# Patient Record
Sex: Male | Born: 1990 | Hispanic: No | Marital: Married | Smoking: Former smoker
Health system: Southern US, Community
[De-identification: ages and names within clinical notes are randomized; demographics above are authoritative.]

## PROBLEM LIST (undated history)

## (undated) DIAGNOSIS — L509 Urticaria, unspecified: Secondary | ICD-10-CM

## (undated) DIAGNOSIS — G709 Myoneural disorder, unspecified: Secondary | ICD-10-CM

## (undated) DIAGNOSIS — H1010 Acute atopic conjunctivitis, unspecified eye: Secondary | ICD-10-CM

## (undated) DIAGNOSIS — T7840XA Allergy, unspecified, initial encounter: Secondary | ICD-10-CM

## (undated) DIAGNOSIS — R7309 Other abnormal glucose: Secondary | ICD-10-CM

## (undated) DIAGNOSIS — F419 Anxiety disorder, unspecified: Secondary | ICD-10-CM

## (undated) DIAGNOSIS — F329 Major depressive disorder, single episode, unspecified: Secondary | ICD-10-CM

## (undated) DIAGNOSIS — J309 Allergic rhinitis, unspecified: Secondary | ICD-10-CM

## (undated) DIAGNOSIS — F32A Depression, unspecified: Secondary | ICD-10-CM

## (undated) HISTORY — PX: LASIK: SHX215

## (undated) HISTORY — DX: Depression, unspecified: F32.A

## (undated) HISTORY — DX: Major depressive disorder, single episode, unspecified: F32.9

## (undated) HISTORY — DX: Allergic rhinitis, unspecified: J30.9

## (undated) HISTORY — DX: Anxiety disorder, unspecified: F41.9

## (undated) HISTORY — DX: Allergy, unspecified, initial encounter: T78.40XA

## (undated) HISTORY — PX: EYE SURGERY: SHX253

## (undated) HISTORY — DX: Other abnormal glucose: R73.09

## (undated) HISTORY — DX: Myoneural disorder, unspecified: G70.9

## (undated) HISTORY — DX: Urticaria, unspecified: L50.9

## (undated) HISTORY — DX: Acute atopic conjunctivitis, unspecified eye: H10.10

---

## 2014-12-25 ENCOUNTER — Emergency Department (HOSPITAL_COMMUNITY)
Admission: EM | Admit: 2014-12-25 | Discharge: 2014-12-25 | Disposition: A | Discharge status: Home / Self Care | Payer: PPO | Attending: Emergency Medicine | Admitting: Emergency Medicine

## 2014-12-25 ENCOUNTER — Encounter (HOSPITAL_COMMUNITY): Payer: Self-pay

## 2014-12-25 ENCOUNTER — Emergency Department (HOSPITAL_COMMUNITY): Payer: PPO

## 2014-12-25 DIAGNOSIS — R569 Unspecified convulsions: Secondary | ICD-10-CM | POA: Diagnosis not present

## 2014-12-25 DIAGNOSIS — Z87891 Personal history of nicotine dependence: Secondary | ICD-10-CM | POA: Diagnosis not present

## 2014-12-25 LAB — BASIC METABOLIC PANEL
Anion gap: 11 (ref 5–15)
BUN: 12 mg/dL (ref 6–23)
CALCIUM: 9.6 mg/dL (ref 8.4–10.5)
CHLORIDE: 103 mmol/L (ref 96–112)
CO2: 24 mmol/L (ref 19–32)
Creatinine, Ser: 1.03 mg/dL (ref 0.50–1.35)
GFR calc Af Amer: >90 mL/min (ref >90)
GFR calc non Af Amer: >90 mL/min (ref >90)
GLUCOSE: 115 mg/dL — AB (ref 70–99)
Potassium: 4.1 mmol/L (ref 3.5–5.1)
SODIUM: 138 mmol/L (ref 135–145)

## 2014-12-25 LAB — URINALYSIS, ROUTINE W REFLEX MICROSCOPIC
Bilirubin Urine: NEGATIVE
GLUCOSE, UA: NEGATIVE mg/dL
HGB URINE DIPSTICK: NEGATIVE
Ketones, ur: NEGATIVE mg/dL
Leukocytes, UA: NEGATIVE
Nitrite: NEGATIVE
Protein, ur: NEGATIVE mg/dL
SPECIFIC GRAVITY, URINE: 1.017 (ref 1.005–1.030)
Urobilinogen, UA: 0.2 mg/dL (ref 0.0–1.0)
pH: 5.5 (ref 5.0–8.0)

## 2014-12-25 LAB — RAPID URINE DRUG SCREEN, HOSP PERFORMED
AMPHETAMINES: NOT DETECTED
BENZODIAZEPINES: NOT DETECTED
Barbiturates: NOT DETECTED
Cocaine: NOT DETECTED
OPIATES: NOT DETECTED
Tetrahydrocannabinol: NOT DETECTED

## 2014-12-25 NOTE — ED Notes (Signed)
Patient transported to CT 

## 2014-12-25 NOTE — ED Notes (Signed)
Per EMS pt was dancing at night club and got hot; friend states pt fell to about 30 minutes of seizure; pt is a&o x 4 on arrival; pt denies hx of seizures; Pt c/o left neck muscular pain on site but denies pain on arrival; pt has c-collar applied; Pt denies alcohol or drug use; pt states he had 1/2 red bull prior;

## 2014-12-25 NOTE — Discharge Instructions (Signed)
Nonepileptic Seizures °Nonepileptic seizures are seizures that are not caused by abnormal electrical signals in your brain. These seizures often seem like epileptic seizures, but they are not caused by epilepsy.  °There are two types of nonepileptic seizures: °· A physiologic nonepileptic seizure results from a disruption in your brain. °· A psychogenic seizure results from emotional stress. These seizures are sometimes called pseudoseizures. °CAUSES  °Causes of physiologic nonepileptic seizures include:  °· Sudden drop in blood pressure. °· Low blood sugar. °· Low levels of salt (sodium) in your blood. °· Low levels of calcium in your blood. °· Migraine. °· Heart rhythm problems. °· Sleep disorders. °· Drug and alcohol abuse. °Common causes of psychogenic nonepileptic seizures include: °· Stress. °· Emotional trauma. °· Sexual or physical abuse. °· Major life events, such as divorce or the death of a loved one. °· Mental health disorders, including panic attack and hyperactivity disorder. °SIGNS AND SYMPTOMS °A nonepileptic seizure can look like an epileptic seizure, including uncontrollable shaking (convulsions), or changes in attention, behavior, or the ability to remain awake and alert. However, there are some differences. Nonepileptic seizures usually: °· Do not cause physical injuries. °· Start slowly. °· Include crying or shrieking. °· Last longer than 2 minutes. °· Have a short recovery time without headache or exhaustion. °DIAGNOSIS  °Your health care provider can usually diagnose nonepileptic seizures after taking your medical history and giving you a physical exam. Your health care provider may want to talk to your friends or relatives who have seen you have a seizure.  °You may also need to have tests to look for causes of physiologic nonepileptic seizures. This may include an electroencephalogram (EEG), which is a test that measures electrical activity in your brain. If you have had an epileptic  seizure, the results of your EEG will be abnormal. If your health care provider thinks you have had a psychogenic nonepileptic seizure, you may need to see a mental health specialist for an evaluation. °TREATMENT  °Treatment depends on the type and cause of your seizures. °· For physiologic nonepileptic seizures, treatment is aimed at addressing the underlying condition that caused the seizures. These seizures usually stop when the underlying condition is properly treated. °· Nonepileptic seizures do not respond to the seizure medicines used to treat epilepsy. °· For psychogenic seizures, you may need to work with a mental health specialist. °HOME CARE INSTRUCTIONS °Home care will depend on the type of nonepileptic seizures you have.  °· Follow all your health care provider's instructions. °· Keep all your follow-up appointments. °SEEK MEDICAL CARE IF: °You continue to have seizures after treatment. °SEEK IMMEDIATE MEDICAL CARE IF: °· Your seizures change or become more frequent. °· You injure yourself during a seizure. °· You have one seizure after another. °· You have trouble recovering from a seizure. °· You have chest pain or trouble breathing. °MAKE SURE YOU: °· Understand these instructions. °· Will watch your condition. °· Will get help right away if you are not doing well or get worse. °Document Released: 10/20/2005 Document Revised: 01/19/2014 Document Reviewed: 07/01/2013 °[employer]® Patient Information ©2015 [employer]. This information is not intended to replace advice given to you by your health care provider. Make sure you discuss any questions you have with your health care provider. ° °

## 2014-12-25 NOTE — ED Provider Notes (Signed)
CSN: [id number]     Arrival date & time 12/25/14  0304 History   First MD Initiated Contact with Patient 12/25/14 [phone]     Chief Complaint  Patient presents with  . Seizures     (Consider location/radiation/quality/duration/timing/severity/associated sxs/prior Treatment) HPI Comments: He reports he was at a night club tonight with friends, dancing. There was a mist/fogger used for effect. He reports that on initial contact with the mist, he fell and felt as if he was choking. This lasted for a matter of minutes. He has full memory of the episode. No LOC or seizure. Later in the evening, again when he came into contact with the same mist, he fell and, per friends, had a generalized seizure with tongue biting. The patient does not remember the second fall. He states he had one seizure years ago, while in high school. He currently has no symptoms. He denies alcohol or drug use.   Patient is a 24 y.o. male presenting with seizures. The history is provided by the patient and a friend. No language interpreter was used.  Seizures Seizure activity on arrival: no   Seizure type:  Grand mal Episode characteristics: tongue biting   Return to baseline: yes   Number of seizures this episode:  1 History of seizures: yes ("Once in high school.")     History reviewed. No pertinent past medical history. History reviewed. No pertinent past surgical history. History reviewed. No pertinent family history. History  Substance Use Topics  . Smoking status: Former Smoker    Quit date: 08/25/2014  . Smokeless tobacco: Not on file  . Alcohol Use: No    Review of Systems  Constitutional: Negative for fever and chills.  HENT: Negative.        Tongue biting.  Respiratory: Negative.   Cardiovascular: Negative.   Gastrointestinal: Negative.   Musculoskeletal: Negative.   Skin: Negative.   Neurological: Positive for seizures. Negative for weakness and headaches.  Psychiatric/Behavioral: Negative.        Allergies  Review of patient's allergies indicates no known allergies.  Home Medications   Prior to Admission medications   Not on File   BP 124/71 mmHg  Pulse 103  Temp(Src) 98.2 F (36.8 C) (Oral)  Resp 26  SpO2 97% Physical Exam  Constitutional: He is oriented to person, place, and time. He appears well-developed and well-nourished. No distress.  HENT:  Head: Normocephalic and atraumatic.  Eyes: Pupils are equal, round, and reactive to light. Right conjunctiva is injected. Left conjunctiva is injected.  Neck: Normal range of motion.  Cardiovascular: Normal rate.   No murmur heard. Pulmonary/Chest: Effort normal. He has no wheezes. He has no rales.  Abdominal: Soft. There is no tenderness. There is no rebound.  Musculoskeletal: Normal range of motion. He exhibits no edema.  Neurological: He is alert and oriented to person, place, and time. Coordination normal.  Speech clear and focused. No facial asymmetry, lateralizing weakness, abnormal coordination. CN's 3-12 grossly intact.   Skin: Skin is warm and dry.    ED Course  Procedures (including critical care time) Labs Review Labs Reviewed  BASIC METABOLIC PANEL  URINE RAPID DRUG SCREEN (HOSP PERFORMED)  URINALYSIS, ROUTINE W REFLEX MICROSCOPIC   Results for orders placed or performed during the hospital encounter of 83/41/96  Basic metabolic panel  Result Value Ref Range   Sodium 138 135 - 145 mmol/L   Potassium 4.1 3.5 - 5.1 mmol/L   Chloride 103 96 - 112 mmol/L   CO2  24 19 - 32 mmol/L   Glucose, Bld 115 (H) 70 - 99 mg/dL   BUN 12 6 - 23 mg/dL   Creatinine, Ser 1.03 0.50 - 1.35 mg/dL   Calcium 9.6 8.4 - 10.5 mg/dL   GFR calc non Af Amer >90 >90 mL/min   GFR calc Af Amer >90 >90 mL/min   Anion gap 11 5 - 15  Urine rapid drug screen (hosp performed)  Result Value Ref Range   Opiates NONE DETECTED NONE DETECTED   Cocaine NONE DETECTED NONE DETECTED   Benzodiazepines NONE DETECTED NONE DETECTED    Amphetamines NONE DETECTED NONE DETECTED   Tetrahydrocannabinol NONE DETECTED NONE DETECTED   Barbiturates NONE DETECTED NONE DETECTED  Urinalysis, Routine w reflex microscopic  Result Value Ref Range   Color, Urine YELLOW YELLOW   APPearance CLEAR CLEAR   Specific Gravity, Urine 1.017 1.005 - 1.030   pH 5.5 5.0 - 8.0   Glucose, UA NEGATIVE NEGATIVE mg/dL   Hgb urine dipstick NEGATIVE NEGATIVE   Bilirubin Urine NEGATIVE NEGATIVE   Ketones, ur NEGATIVE NEGATIVE mg/dL   Protein, ur NEGATIVE NEGATIVE mg/dL   Urobilinogen, UA 0.2 0.0 - 1.0 mg/dL   Nitrite NEGATIVE NEGATIVE   Leukocytes, UA NEGATIVE NEGATIVE   Ct Head Wo Contrast  12/25/2014   CLINICAL DATA:  [place] while dancing at night club, possible seizure. LEFT neck pain.  EXAM: CT HEAD WITHOUT CONTRAST  CT CERVICAL SPINE WITHOUT CONTRAST  TECHNIQUE: Multidetector CT imaging of the head and cervical spine was performed following the standard protocol without intravenous contrast. Multiplanar CT image reconstructions of the cervical spine were also generated.  COMPARISON:  None.  FINDINGS: CT HEAD FINDINGS  The ventricles and sulci are normal. No intraparenchymal hemorrhage, mass effect nor midline shift. No acute large vascular territory infarcts.  No abnormal extra-axial fluid collections. Basal cisterns are patent.  No skull fracture. The included ocular globes and orbital contents are non-suspicious. Small LEFT maxillary mucosal retention cyst without paranasal sinus air-fluid levels. The mastoid air cells are well aerated.  CT CERVICAL SPINE FINDINGS  Cervical vertebral bodies and posterior elements are intact and aligned with maintenance of the cervical lordosis. Intervertebral disc heights preserved, mild ventral endplate spurring I3-7. No destructive bony lesions. C1-2 articulation maintained. Included prevertebral and paraspinal soft tissues are nonsuspicious; subcentimeter focus of air within the RIGHT neck adjacent to the trachea  consistent with peritracheal air cyst.  IMPRESSION: CT HEAD: No acute intracranial process; normal noncontrast CT of the head.  CT CERVICAL SPINE: Straightened cervical lordosis without acute fracture or malalignment.  Small RIGHT paratracheal air cyst.   Electronically Signed   By: Elon Alas   On: 12/25/2014 05:05   Ct Cervical Spine Wo Contrast  12/25/2014   CLINICAL DATA:  [place] while dancing at night club, possible seizure. LEFT neck pain.  EXAM: CT HEAD WITHOUT CONTRAST  CT CERVICAL SPINE WITHOUT CONTRAST  TECHNIQUE: Multidetector CT imaging of the head and cervical spine was performed following the standard protocol without intravenous contrast. Multiplanar CT image reconstructions of the cervical spine were also generated.  COMPARISON:  None.  FINDINGS: CT HEAD FINDINGS  The ventricles and sulci are normal. No intraparenchymal hemorrhage, mass effect nor midline shift. No acute large vascular territory infarcts.  No abnormal extra-axial fluid collections. Basal cisterns are patent.  No skull fracture. The included ocular globes and orbital contents are non-suspicious. Small LEFT maxillary mucosal retention cyst without paranasal sinus air-fluid levels. The mastoid air cells are  well aerated.  CT CERVICAL SPINE FINDINGS  Cervical vertebral bodies and posterior elements are intact and aligned with maintenance of the cervical lordosis. Intervertebral disc heights preserved, mild ventral endplate spurring K0-9. No destructive bony lesions. C1-2 articulation maintained. Included prevertebral and paraspinal soft tissues are nonsuspicious; subcentimeter focus of air within the RIGHT neck adjacent to the trachea consistent with peritracheal air cyst.  IMPRESSION: CT HEAD: No acute intracranial process; normal noncontrast CT of the head.  CT CERVICAL SPINE: Straightened cervical lordosis without acute fracture or malalignment.  Small RIGHT paratracheal air cyst.   Electronically Signed   By: Elon Alas    On: 12/25/2014 05:05     Imaging Review No results found.   EKG Interpretation   Date/Time:  Friday December 25 2014 03:20:21 EDT Ventricular Rate:  106 PR Interval:  152 QRS Duration: 80 QT Interval:  330 QTC Calculation: 438 R Axis:   18 Text Interpretation:  Sinus tachycardia ST elev, probable normal early  repol pattern Confirmed by Glynn Octave [phone]) on 12/25/2014  3:28:47 AM      MDM   Final diagnoses:  None    1. Seizure  No seizure activity in ED. CT's, labs negative. Discussed the patient with Dr. Janann Colonel (neurology) who advises outpatient EEG without starting him on medications now. Discussed results of evaluation with the patient who is agreeable to outpatient follow up. Stable for discharge.     Charlann Lange, PA-C 12/25/14 [id number]  Everlene Balls, MD 12/27/14 [phone]

## 2014-12-29 ENCOUNTER — Telehealth: Payer: Self-pay | Admitting: *Deleted

## 2014-12-29 NOTE — Telephone Encounter (Signed)
Neurologist referred to does not accept pt insurance.  NCM advised to establish PCP for new referral.

## 2014-12-31 ENCOUNTER — Ambulatory Visit: Payer: Self-pay | Admitting: Neurology

## 2015-09-01 ENCOUNTER — Ambulatory Visit (INDEPENDENT_AMBULATORY_CARE_PROVIDER_SITE_OTHER): Payer: PPO | Admitting: Allergy and Immunology

## 2015-09-01 ENCOUNTER — Encounter: Payer: Self-pay | Admitting: Allergy and Immunology

## 2015-09-01 VITALS — BP 114/76 | HR 80 | Temp 98.3°F | Resp 16 | Ht 65.75 in | Wt 146.8 lb

## 2015-09-01 DIAGNOSIS — H04129 Dry eye syndrome of unspecified lacrimal gland: Secondary | ICD-10-CM | POA: Insufficient documentation

## 2015-09-01 DIAGNOSIS — L500 Allergic urticaria: Secondary | ICD-10-CM | POA: Diagnosis not present

## 2015-09-01 DIAGNOSIS — J309 Allergic rhinitis, unspecified: Secondary | ICD-10-CM

## 2015-09-01 DIAGNOSIS — H04123 Dry eye syndrome of bilateral lacrimal glands: Secondary | ICD-10-CM

## 2015-09-01 DIAGNOSIS — H1010 Acute atopic conjunctivitis, unspecified eye: Secondary | ICD-10-CM | POA: Diagnosis not present

## 2015-09-01 MED ORDER — DOXYCYCLINE HYCLATE 100 MG PO TBEC: 100.0000 mg | DELAYED_RELEASE_TABLET | Freq: Every day | ORAL | Status: DC

## 2015-09-01 NOTE — Patient Instructions (Addendum)
  1. Stop all antihistamines  2. Start Doxycycline 100mg  one tablet one time per day  3. Start OTC Rhinocort one spray each nostril one time per day  4. Continue Restasis eye drops and SYSTANE eye drops  5. Submit for Xolair administration  6. Can continue with Pazeo eye drop one time per day  7. Blood - CBC w/diff, CMP, TSH, T4, T.P., sed, U/A  8. Return in 4 weeks or earlier if problem

## 2015-09-01 NOTE — Progress Notes (Signed)
Jeremy Villa    NEW PATIENT NOTE  Referring Provider: No ref. provider found Primary Provider: No PCP Per Patient    Subjective:   Chief Complaint:  Jeremy Villa is a 24 y.o. male with a chief complaint of Pruritis and Other  who presents to the clinic with the following problems:  HPI Comments:  Jeremy Villa presents to this clinic on 09/01/2015 in evaluation of 2 main issues. First, he has a urticaria that has been present for 6 years. He develops red raised itchy lesions across his skin that never heal with scar or hyperpigmentation. They'll apparently do respond to the use of multiple antihistamines but unfortunately he has dry eye syndrome and antihistamines are making the situation a little more active. He takes Restasis and he has tear duct plugs. His second problem is the fact that he has constant redness of his eyes and itchy of his eyes which does appear to respond to the use of some antihistamines and eye drops but once again he has dry eye syndrome and must be careful about using antihistamines. He has minimal nasal symptoms. He did visit with an allergist in [place] who gave him immunotherapy which she has been using for the past 1-1/2 years which has not resulted in any improvement regarding either his urticaria or his eye disease. She was allergic to multiple aeroallergens and some attempted allergen avoidance measures was completed. He has no other associated atopic disease.   History reviewed. No pertinent past medical history.  Past Surgical History  Procedure Laterality Date  . Lasik      Outpatient Encounter Prescriptions as of 09/01/2015  Medication Sig  . azelastine (OPTIVAR) 0.05 % ophthalmic solution 1 drop as needed.  . cycloSPORINE (RESTASIS) 0.05 % ophthalmic emulsion 1 drop.  Marland Kitchen EPINEPHrine (EPIPEN 2-PAK) 0.3 mg/0.3 mL IJ SOAJ injection Inject into the muscle. Use as directed for life-threatening  allergic reaction.  . fexofenadine (ALLEGRA) 180 MG tablet Take 180 mg by mouth.  Marland Kitchen ketotifen (ZADITOR) 0.025 % ophthalmic solution 1 drop.  . Multiple Vitamin (MULTIVITAMIN) tablet Take 1 tablet by mouth daily.  . Olopatadine HCl 0.6 % SOLN Place into the nose at bedtime.  . Omega-3 Fatty Acids (FISH OIL PO) Take by mouth as needed.  . Polyvinyl Alcohol-Povidone (REFRESH OP) Apply to eye as needed.  Bethann Humble Sulfate (EYE DROPS RELIEF OP) Apply to eye as needed.  . cetirizine (ZYRTEC) 10 MG tablet Take 10 mg by mouth. Reported on 09/01/2015  . Cholecalciferol (D 1000) 1000 UNITS capsule Take 1,000 Units by mouth.  . doxycycline (DORYX) 100 MG EC tablet Take 1 tablet (100 mg total) by mouth daily.  . hydrochlorothiazide (HYDRODIURIL) 25 MG tablet Take 25 mg by mouth. Reported on 09/01/2015  . hydrOXYzine (ATARAX/VISTARIL) 25 MG tablet Take 25 mg by mouth. Reported on 09/01/2015   No facility-administered encounter medications on file as of 09/01/2015.    Meds ordered this encounter  Medications  . doxycycline (DORYX) 100 MG EC tablet    Sig: Take 1 tablet (100 mg total) by mouth daily.    Dispense:  30 tablet    Refill:  3    Allergies  Allergen Reactions  . Tree Extract Itching  . Wheat Bran Itching    Review of Systems  Constitutional: Negative for fever, chills and fatigue.  HENT: Negative for congestion, ear pain, facial swelling, hearing loss, nosebleeds, postnasal drip, rhinorrhea, sinus pressure, sneezing, sore throat, tinnitus,  trouble swallowing and voice change.   Eyes: Positive for redness and itching. Negative for pain and discharge.  Respiratory: Negative for cough, chest tightness, shortness of breath and wheezing.   Cardiovascular: Negative for chest pain and leg swelling.  Gastrointestinal: Negative for nausea, vomiting and abdominal pain.  Endocrine: Negative for cold intolerance and heat intolerance.  Musculoskeletal: Positive for back pain  (intermittent low back pain). Negative for myalgias and arthralgias.  Skin: Positive for rash.  Allergic/Immunologic: Negative.   Neurological: Negative for dizziness and headaches.  Hematological: Negative for adenopathy.    Family History  Problem Relation Age of Onset  . Diabetes Mother   . High blood pressure Father     Social History   Social History  . Marital Status: Single    Spouse Name: N/A  . Number of Children: N/A  . Years of Education: N/A   Occupational History  . Not on file.   Social History Main Topics  . Smoking status: Former Smoker    Quit date: 08/25/2014  . Smokeless tobacco: Not on file  . Alcohol Use: No  . Drug Use: No  . Sexual Activity: Not on file   Other Topics Concern  . Not on file   Social History Narrative    Environmental and Social history  Lives in a apartment with a dry environment, no animals located inside the household, carpeting in the bedroom, sleeping on a mattress with plastic on the bed and pillow, and no smokers located inside the household. He is a Ship broker.   Objective:   Filed Vitals:   09/01/15 0858  BP: 114/76  Pulse: 80  Temp: 98.3 F (36.8 C)  Resp: 16   Height: 5' 5.75" (167 cm) Weight: 146 lb 13.2 oz (66.6 kg)  Physical Exam  Constitutional: He appears well-developed and well-nourished. No distress.  HENT:  Head: Normocephalic and atraumatic. Head is without right periorbital erythema and without left periorbital erythema.  Right Ear: Tympanic membrane, external ear and ear canal normal. No drainage or tenderness. No foreign bodies. Tympanic membrane is not injected, not scarred, not perforated, not erythematous, not retracted and not bulging. No middle ear effusion.  Left Ear: Tympanic membrane, external ear and ear canal normal. No drainage or tenderness. No foreign bodies. Tympanic membrane is not injected, not scarred, not perforated, not erythematous, not retracted and not bulging.  No middle ear  effusion.  Nose: Nose normal. No mucosal edema, rhinorrhea, nose lacerations or sinus tenderness.  No foreign bodies.  Mouth/Throat: Oropharynx is clear and moist. No oropharyngeal exudate, posterior oropharyngeal edema, posterior oropharyngeal erythema or tonsillar abscesses.  Eyes: Lids are normal. Right eye exhibits no chemosis, no discharge and no exudate. No foreign body present in the right eye. Left eye exhibits no chemosis, no discharge and no exudate. No foreign body present in the left eye. Right conjunctiva is injected. Left conjunctiva is injected.  Neck: Neck supple. No tracheal tenderness present. No tracheal deviation and no edema present. No thyroid mass and no thyromegaly present.  Cardiovascular: Normal rate, regular rhythm, S1 normal and S2 normal.  Exam reveals no gallop.   No murmur heard. Pulmonary/Chest: No accessory muscle usage or stridor. No respiratory distress. He has no wheezes. He has no rhonchi. He has no rales.  Abdominal: Soft. He exhibits no distension and no mass. There is no tenderness. There is no rebound and no guarding.  Musculoskeletal: He exhibits no edema.  Lymphadenopathy:       Head (right side):  No tonsillar adenopathy present.       Head (left side): No tonsillar adenopathy present.    He has no cervical adenopathy.  Neurological: He is alert.  Skin: No rash noted. He is not diaphoretic.  Psychiatric: He has a normal mood and affect. His behavior is normal.    Diagnostics:   none   Assessment and Plan:    1. Allergic urticaria   2. Allergic rhinoconjunctivitis   3. Dry eye syndrome, bilateral      1. Stop all antihistamines  2. Start Doxycycline 100mg  one tablet one time per day  3. Start OTC Rhinocort one spray each nostril one time per day  4. Continue Restasis eye drops and SYSTANE eye drops  5. Submit for Xolair administration  6. Can continue with Pazeo eye drop one time per day  7. Blood - CBC w/diff, CMP, TSH, T4, T.P.,  sed, U/A  8. Return in 4 weeks or earlier if problem   Ernest has to allergic conditions that would probably benefit from the use of antihistamines including his chronic urticaria and his allergic rhinoconjunctivitis but unfortunately he has rather significant dry eye syndrome and it would be best for him not to use any antihistamines to treat this condition. I think the best way to approach his issue is to give him Xolair to treat both his atopic disease involving his eyes and his skin and as well we will give him some nasal steroids and start him on doxycycline for possible component of ocular rosacea that may be contributing to some of his eyes symptoms. In addition, we will screen his blood for worrisome systemic disease is contributing to his immunologic overactivity. I'll regroup with him in a possibly 4 weeks or earlier if there is a problem.    Allena Katz, MD [place]

## 2015-09-03 LAB — URINALYSIS
Bilirubin, UA: NEGATIVE
GLUCOSE, UA: NEGATIVE
Ketones, UA: NEGATIVE
LEUKOCYTES UA: NEGATIVE
Nitrite, UA: NEGATIVE
PH UA: 5.5 (ref 5.0–7.5)
Protein, UA: NEGATIVE
RBC UA: NEGATIVE
SPEC GRAV UA: 1.024 (ref 1.005–1.030)
Urobilinogen, Ur: 0.2 mg/dL (ref 0.2–1.0)

## 2015-09-03 LAB — COMPREHENSIVE METABOLIC PANEL
ALT: 27 IU/L (ref 0–44)
AST: 28 IU/L (ref 0–40)
Albumin/Globulin Ratio: 2 (ref 1.1–2.5)
Albumin: 4.9 g/dL (ref 3.5–5.5)
Alkaline Phosphatase: 68 IU/L (ref 39–117)
BUN/Creatinine Ratio: 16 (ref 8–19)
BUN: 15 mg/dL (ref 6–20)
CHLORIDE: 100 mmol/L (ref 96–106)
CO2: 23 mmol/L (ref 18–29)
Calcium: 10.3 mg/dL — ABNORMAL HIGH (ref 8.7–10.2)
Creatinine, Ser: 0.91 mg/dL (ref 0.76–1.27)
GFR calc non Af Amer: 116 mL/min/{1.73_m2} (ref >59)
GFR, EST AFRICAN AMERICAN: 134 mL/min/{1.73_m2} (ref >59)
Globulin, Total: 2.5 g/dL (ref 1.5–4.5)
Glucose: 77 mg/dL (ref 65–99)
Potassium: 4.7 mmol/L (ref 3.5–5.2)
Sodium: 139 mmol/L (ref 134–144)
Total Protein: 7.4 g/dL (ref 6.0–8.5)

## 2015-09-03 LAB — TSH: TSH: 2.78 u[IU]/mL (ref 0.450–4.500)

## 2015-09-03 LAB — T4, FREE: Free T4: 1.28 ng/dL (ref 0.82–1.77)

## 2015-09-03 LAB — SEDIMENTATION RATE: SED RATE: 2 mm/h (ref 0–15)

## 2015-09-04 LAB — SPECIMEN STATUS REPORT

## 2015-09-04 LAB — CBC WITH DIFFERENTIAL/PLATELET
BASOS: 0 %
Basophils Absolute: 0 10*3/uL (ref 0.0–0.2)
EOS (ABSOLUTE): 0.1 10*3/uL (ref 0.0–0.4)
EOS: 2 %
HEMATOCRIT: 46.8 % (ref 37.5–51.0)
Hemoglobin: 14.8 g/dL (ref 12.6–17.7)
IMMATURE GRANS (ABS): 0 10*3/uL (ref 0.0–0.1)
Immature Granulocytes: 0 %
LYMPHS: 37 %
Lymphocytes Absolute: 3 10*3/uL (ref 0.7–3.1)
MCH: 27 pg (ref 26.6–33.0)
MCHC: 31.6 g/dL (ref 31.5–35.7)
MCV: 85 fL (ref 79–97)
Monocytes Absolute: 0.5 10*3/uL (ref 0.1–0.9)
Monocytes: 7 %
Neutrophils Absolute: 4.4 10*3/uL (ref 1.4–7.0)
Neutrophils: 54 %
PLATELETS: 461 10*3/uL — AB (ref 150–379)
RBC: 5.48 x10E6/uL (ref 4.14–5.80)
RDW: 14.5 % (ref 12.3–15.4)
WBC: 8.1 10*3/uL (ref 3.4–10.8)

## 2015-09-06 ENCOUNTER — Other Ambulatory Visit: Payer: Self-pay | Admitting: *Deleted

## 2015-09-06 MED ORDER — DOXYCYCLINE HYCLATE 100 MG PO CAPS: 100.0000 mg | ORAL_CAPSULE | Freq: Two times a day (BID) | ORAL | Status: DC

## 2015-09-28 ENCOUNTER — Ambulatory Visit (INDEPENDENT_AMBULATORY_CARE_PROVIDER_SITE_OTHER): Payer: PPO | Admitting: Allergy and Immunology

## 2015-09-28 ENCOUNTER — Ambulatory Visit: Payer: PPO

## 2015-09-28 ENCOUNTER — Encounter: Payer: Self-pay | Admitting: Allergy and Immunology

## 2015-09-28 VITALS — BP 122/72 | HR 80 | Resp 16

## 2015-09-28 DIAGNOSIS — L500 Allergic urticaria: Secondary | ICD-10-CM

## 2015-09-28 DIAGNOSIS — H04123 Dry eye syndrome of bilateral lacrimal glands: Secondary | ICD-10-CM

## 2015-09-28 DIAGNOSIS — J309 Allergic rhinitis, unspecified: Secondary | ICD-10-CM

## 2015-09-28 DIAGNOSIS — H1010 Acute atopic conjunctivitis, unspecified eye: Secondary | ICD-10-CM

## 2015-09-28 MED ORDER — DOXYCYCLINE HYCLATE 100 MG PO CAPS: 100.0000 mg | ORAL_CAPSULE | Freq: Every day | ORAL | Status: DC

## 2015-09-28 MED ORDER — OLOPATADINE HCL 0.7 % OP SOLN
1.0000 [drp] | Freq: Every day | OPHTHALMIC | Status: DC | PRN
Start: 2015-09-28 — End: 2017-06-30

## 2015-09-28 NOTE — Patient Instructions (Signed)
  1. Stay away from all antihistamines  2. Start Doxycycline 100mg  one tablet one time per day  3. OTC Rhinocort one spray each nostril one time per day  4. Continue Restasis eye drops and SYSTANE eye drops  5.  Xolair today and monthly  6. Can continue with Pazeo eye drop one time per day  7. Return in 12 weeks or earlier if problem

## 2015-09-28 NOTE — Progress Notes (Signed)
[facility]  Follow-up Note  Referring Provider: No ref. provider found Primary Provider: No PCP Per Patient Date of Office Visit: 09/28/2015  Subjective:   Jeremy Villa is a 25 y.o. male who returns to the [facility] in re-evaluation of the following:  HPI Comments:  Jeremy Villa returns to this clinic on 09/28/2015 in reevaluation of his urticaria and allergic rhinitis and dry eye syndrome. He has had much improvement since stopping all his antihistamines and using doxycycline on a regular basis regarding his eyes. His nose is been doing quite well while using Rhinocort. He continues on his Restasis and has been using some Systane eyedrops and occasionally some patio. He is getting his first Xolair injection today. He still does continue to have problems with intermittent urticaria as he is not using any antihistamines.   Current Outpatient Prescriptions on File Prior to Visit  Medication Sig Dispense Refill  . cycloSPORINE (RESTASIS) 0.05 % ophthalmic emulsion 1 drop.    Marland Kitchen EPINEPHrine (EPIPEN 2-PAK) 0.3 mg/0.3 mL IJ SOAJ injection Inject into the muscle. Use as directed for life-threatening allergic reaction.    . Multiple Vitamin (MULTIVITAMIN) tablet Take 1 tablet by mouth daily.    . Polyvinyl Alcohol-Povidone (REFRESH OP) Apply to eye as needed. Reported on 09/28/2015    . azelastine (OPTIVAR) 0.05 % ophthalmic solution 1 drop as needed. Reported on 09/28/2015    . cetirizine (ZYRTEC) 10 MG tablet Take 10 mg by mouth. Reported on 09/28/2015    . fexofenadine (ALLEGRA) 180 MG tablet Take 180 mg by mouth. Reported on 09/28/2015    . hydrochlorothiazide (HYDRODIURIL) 25 MG tablet Take 25 mg by mouth. Reported on 09/28/2015    . hydrOXYzine (ATARAX/VISTARIL) 25 MG tablet Take 25 mg by mouth. Reported on 09/28/2015    . ketotifen (ZADITOR) 0.025 % ophthalmic solution 1 drop. Reported on 09/28/2015    . Olopatadine HCl 0.6 %  SOLN Place into the nose at bedtime. Reported on 09/28/2015    . Omega-3 Fatty Acids (FISH OIL PO) Take by mouth as needed. Reported on 09/28/2015    . Tetrahydrozoline-Zn Sulfate (EYE DROPS RELIEF OP) Apply to eye as needed. Reported on 09/28/2015     No current facility-administered medications on file prior to visit.    Meds ordered this encounter  Medications  . doxycycline (VIBRAMYCIN) 100 MG capsule    Sig: Take 1 capsule (100 mg total) by mouth daily.    Dispense:  30 capsule    Refill:  3  . Olopatadine HCl (PAZEO) 0.7 % SOLN    Sig: Apply 1 drop to eye daily as needed (for itchy eyes.).    Dispense:  2.5 mL    Refill:  5    Past Medical History  Diagnosis Date  . Urticaria     Past Surgical History  Procedure Laterality Date  . Lasik      Allergies  Allergen Reactions  . Tree Extract Itching  . Wheat Bran Itching    Review of systems negative except as noted in HPI / PMHx or noted below:  Review of Systems  Constitutional: Negative.   HENT: Negative.   Eyes: Negative.   Respiratory: Negative.   Cardiovascular: Negative.   Gastrointestinal: Negative.   Genitourinary: Negative.   Musculoskeletal: Negative.   Skin: Negative.   Neurological: Negative.   Endo/Heme/Allergies: Negative.   Psychiatric/Behavioral: Negative.      Objective:   Filed Vitals:  09/28/15 1627  BP: 122/72  Pulse: 80  Resp: 16          Physical Exam  Constitutional: He is well-developed, well-nourished, and in no distress. No distress.  HENT:  Head: Normocephalic.  Right Ear: Tympanic membrane, external ear and ear canal normal.  Left Ear: Tympanic membrane, external ear and ear canal normal.  Nose: Nose normal. No mucosal edema or rhinorrhea.  Mouth/Throat: Uvula is midline, oropharynx is clear and moist and mucous membranes are normal. No oropharyngeal exudate.  Eyes: Right conjunctiva is injected (Slight). Left conjunctiva is injected (Slight).  Neck: Trachea normal.  No tracheal tenderness present. No tracheal deviation present. No thyromegaly present.  Cardiovascular: Normal rate, regular rhythm, S1 normal, S2 normal and normal heart sounds.   No murmur heard. Pulmonary/Chest: Breath sounds normal. No stridor. No respiratory distress. He has no wheezes. He has no rales.  Musculoskeletal: He exhibits no edema.  Lymphadenopathy:       Head (right side): No tonsillar adenopathy present.       Head (left side): No tonsillar adenopathy present.    He has no cervical adenopathy.    He has no axillary adenopathy.  Neurological: He is alert. Gait normal.  Skin: No rash noted. He is not diaphoretic. No erythema. Nails show no clubbing.  Psychiatric: Mood and affect normal.    Diagnostics: None  Assessment and Plan:   1. Allergic rhinoconjunctivitis   2. Allergic urticaria   3. Dry eye syndrome, bilateral      1. Stay away from all antihistamines  2. Doxycycline 100mg  one tablet one time per day  3. OTC Rhinocort one spray each nostril one time per day  4. Continue Restasis eye drops and SYSTANE eye drops  5.  Xolair today and monthly  6. Can continue with Pazeo eye drop one time per day  7. Return in 12 weeks or earlier if problem   Jeremy Villa appears to be doing better since we stopped his antihistamines regarding his dry eye. His eyes also appear to be doing better while consistently using his doxycycline his nose is not been causing him a problem while using Rhinocort. His hives are still active and hopefully that will all be managed with the administration of Xolair. I will regroup with him an approximate 12 weeks and we'll make a determination about how to proceed pending his response.  Allena Katz, MD [place]

## 2015-10-14 ENCOUNTER — Other Ambulatory Visit: Payer: Self-pay

## 2015-10-14 MED ORDER — OLOPATADINE HCL 0.2 % OP SOLN: OPHTHALMIC | Status: DC

## 2015-10-14 NOTE — Telephone Encounter (Signed)
Changing patient to [place] since Lillie Fragmin is denied by insurance. Patient informed. [place] sent.

## 2015-10-27 ENCOUNTER — Ambulatory Visit (INDEPENDENT_AMBULATORY_CARE_PROVIDER_SITE_OTHER): Payer: PPO

## 2015-10-27 DIAGNOSIS — L500 Allergic urticaria: Secondary | ICD-10-CM | POA: Diagnosis not present

## 2015-11-03 ENCOUNTER — Other Ambulatory Visit: Payer: Self-pay | Admitting: Neurology

## 2015-11-03 MED ORDER — BEPOTASTINE BESILATE 1.5 % OP SOLN
OPHTHALMIC | Status: DC
Start: 2015-11-03 — End: 2015-12-21

## 2015-11-30 ENCOUNTER — Ambulatory Visit (HOSPITAL_COMMUNITY): Payer: PPO | Admitting: Psychiatry

## 2015-12-02 ENCOUNTER — Ambulatory Visit (INDEPENDENT_AMBULATORY_CARE_PROVIDER_SITE_OTHER): Payer: PPO

## 2015-12-02 DIAGNOSIS — L500 Allergic urticaria: Secondary | ICD-10-CM | POA: Diagnosis not present

## 2015-12-02 MED ORDER — OMALIZUMAB 150 MG ~~LOC~~ SOLR: 300.0000 mg | SUBCUTANEOUS | Status: DC

## 2015-12-02 MED ORDER — OMALIZUMAB 150 MG ~~LOC~~ SOLR
300.0000 mg | SUBCUTANEOUS | Status: AC
  Administered 2015-12-02 – 2017-12-13 (×17): 300 mg via SUBCUTANEOUS

## 2015-12-20 ENCOUNTER — Emergency Department (HOSPITAL_COMMUNITY)
Admission: EM | Admit: 2015-12-20 | Discharge: 2015-12-21 | Disposition: A | Discharge status: Home / Self Care | Payer: No Typology Code available for payment source | Attending: Emergency Medicine | Admitting: Emergency Medicine

## 2015-12-20 ENCOUNTER — Emergency Department (HOSPITAL_COMMUNITY): Payer: No Typology Code available for payment source

## 2015-12-20 ENCOUNTER — Encounter (HOSPITAL_COMMUNITY): Payer: Self-pay | Admitting: Emergency Medicine

## 2015-12-20 DIAGNOSIS — Z79899 Other long term (current) drug therapy: Secondary | ICD-10-CM | POA: Diagnosis not present

## 2015-12-20 DIAGNOSIS — S3992XA Unspecified injury of lower back, initial encounter: Secondary | ICD-10-CM | POA: Insufficient documentation

## 2015-12-20 DIAGNOSIS — S8991XA Unspecified injury of right lower leg, initial encounter: Secondary | ICD-10-CM | POA: Insufficient documentation

## 2015-12-20 DIAGNOSIS — Z87891 Personal history of nicotine dependence: Secondary | ICD-10-CM | POA: Diagnosis not present

## 2015-12-20 DIAGNOSIS — S199XXA Unspecified injury of neck, initial encounter: Secondary | ICD-10-CM | POA: Insufficient documentation

## 2015-12-20 DIAGNOSIS — Z872 Personal history of diseases of the skin and subcutaneous tissue: Secondary | ICD-10-CM | POA: Diagnosis not present

## 2015-12-20 DIAGNOSIS — Y999 Unspecified external cause status: Secondary | ICD-10-CM | POA: Diagnosis not present

## 2015-12-20 DIAGNOSIS — M7918 Myalgia, other site: Secondary | ICD-10-CM

## 2015-12-20 DIAGNOSIS — Y92410 Unspecified street and highway as the place of occurrence of the external cause: Secondary | ICD-10-CM | POA: Diagnosis not present

## 2015-12-20 DIAGNOSIS — S0990XA Unspecified injury of head, initial encounter: Secondary | ICD-10-CM | POA: Insufficient documentation

## 2015-12-20 DIAGNOSIS — Y9389 Activity, other specified: Secondary | ICD-10-CM | POA: Diagnosis not present

## 2015-12-20 DIAGNOSIS — Z792 Long term (current) use of antibiotics: Secondary | ICD-10-CM | POA: Insufficient documentation

## 2015-12-20 MED ORDER — METHOCARBAMOL 500 MG PO TABS: 500.0000 mg | ORAL_TABLET | Freq: Two times a day (BID) | ORAL | Status: DC

## 2015-12-20 MED ORDER — NAPROXEN 500 MG PO TABS: 500.0000 mg | ORAL_TABLET | Freq: Two times a day (BID) | ORAL | Status: DC

## 2015-12-20 NOTE — Discharge Instructions (Signed)
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
## 2015-12-21 ENCOUNTER — Ambulatory Visit (INDEPENDENT_AMBULATORY_CARE_PROVIDER_SITE_OTHER): Payer: PPO | Admitting: Allergy and Immunology

## 2015-12-21 ENCOUNTER — Encounter: Payer: Self-pay | Admitting: Allergy and Immunology

## 2015-12-21 VITALS — BP 132/92 | HR 84 | Resp 16

## 2015-12-21 DIAGNOSIS — H04123 Dry eye syndrome of bilateral lacrimal glands: Secondary | ICD-10-CM | POA: Diagnosis not present

## 2015-12-21 DIAGNOSIS — J309 Allergic rhinitis, unspecified: Secondary | ICD-10-CM | POA: Diagnosis not present

## 2015-12-21 DIAGNOSIS — H1010 Acute atopic conjunctivitis, unspecified eye: Secondary | ICD-10-CM

## 2015-12-21 DIAGNOSIS — L500 Allergic urticaria: Secondary | ICD-10-CM

## 2015-12-21 NOTE — Progress Notes (Signed)
Follow-up Note  Referring Provider: No ref. provider found Primary Provider: No PCP Per Patient Date of Office Visit: 12/21/2015  Subjective:   Jeremy Villa (DOB: 1991-08-19) is a 25 y.o. male who returns to the [facility] on 12/21/2015 in re-evaluation of the following:  HPI Comments: [name] returns to this clinic in reevaluation of his urticaria treated with omalizumab and allergic rhinitis and dry eye syndrome. Since using his doxycycline he's done quite well regarding his eye and has had very little problem until he started Allegra recently for what he thought was allergies. Since that point time his eyes are irritated and they've been extremely dry. However it should be noted that his allergies were associated with a high fever for multiple days coughing, body aches, fatigue, and her requirement for a different antibiotic from an urgent care center. Fortunately, all these issues appear to be improving tremendously and is only left with a little bit of cough at this point in time. He does have issues with nasal congestion sneezing on occasion but this is also better with Xolair. He is interested in undergoing a course of immunotherapy to treat his allergic rhinoconjunctivitis.     Medication List           amoxicillin 500 MG capsule  Commonly known as:  AMOXIL  TK 1 C PO TID     azelastine 0.05 % ophthalmic solution  Commonly known as:  OPTIVAR  1 drop as needed. Reported on 09/28/2015     Bepotastine Besilate 1.5 % Soln  Commonly known as:  BEPREVE  Instill 1 drop into each eye every day as needed for itchy eyes     CIALIS 5 MG tablet  Generic drug:  tadalafil  TK 1 T PO QD     cycloSPORINE 0.05 % ophthalmic emulsion  Commonly known as:  RESTASIS  1 drop.     doxycycline 100 MG capsule  Commonly known as:  VIBRAMYCIN  Take 1 capsule (100 mg total) by mouth daily.     EPIPEN 2-PAK 0.3 mg/0.3 mL Soaj injection  Generic drug:  EPINEPHrine  Inject  into the muscle. Use as directed for life-threatening allergic reaction.     escitalopram 10 MG tablet  Commonly known as:  LEXAPRO  TK 1 T PO ONCE D     eszopiclone 2 MG Tabs tablet  Commonly known as:  LUNESTA  TK 1 T PO QHS FOR SLEEP     fexofenadine 180 MG tablet  Commonly known as:  ALLEGRA  Take 180 mg by mouth. Reported on 09/28/2015     FISH OIL PO  Take by mouth as needed. Reported on 09/28/2015     FLUVIRIN Susp  Generic drug:  Influenza Vac Typ A&B Surf Ant  ADM 0.5ML IM UTD     ketotifen 0.025 % ophthalmic solution  Commonly known as:  ZADITOR  1 drop. Reported on 09/28/2015     methocarbamol 750 MG tablet  Commonly known as:  ROBAXIN  TK 1 T PO Q 6 H PRF SPASMS     naproxen 500 MG tablet  Commonly known as:  NAPROSYN  Take 1 tablet (500 mg total) by mouth 2 (two) times daily.     Olopatadine HCl 0.6 % Soln  Place into the nose at bedtime. Reported on 09/28/2015     Olopatadine HCl 0.7 % Soln  Commonly known as:  PAZEO  Apply 1 drop to eye daily as needed (for itchy eyes.).  Olopatadine HCl 0.2 % Soln  Commonly known as:  PATADAY  Use one drop in each eye once daily as needed for itchy eyes.     REFRESH OP  Apply to eye as needed. Reported on 09/28/2015        Past Medical History  Diagnosis Date  . Urticaria   . Depression   . Allergic rhinoconjunctivitis     Past Surgical History  Procedure Laterality Date  . Lasik      Allergies  Allergen Reactions  . Tree Extract Itching  . Wheat Bran Itching    Review of systems negative except as noted in HPI / PMHx or noted below:  Review of Systems  Constitutional: Negative.   HENT: Negative.   Eyes: Negative.   Respiratory: Negative.   Cardiovascular: Negative.   Gastrointestinal: Negative.   Genitourinary: Negative.   Musculoskeletal: Negative.   Skin: Negative.   Neurological: Negative.   Endo/Heme/Allergies: Negative.   Psychiatric/Behavioral: Negative.      Objective:    Filed Vitals:   12/21/15 1715  BP: 132/92  Pulse: 84  Resp: 16          Physical Exam  Constitutional: He is well-developed, well-nourished, and in no distress.  HENT:  Head: Normocephalic.  Right Ear: Tympanic membrane, external ear and ear canal normal.  Left Ear: Tympanic membrane, external ear and ear canal normal.  Nose: Nose normal. No mucosal edema or rhinorrhea.  Mouth/Throat: Uvula is midline, oropharynx is clear and moist and mucous membranes are normal. No oropharyngeal exudate.  Eyes: Right conjunctiva is injected. Left conjunctiva is injected.  Neck: Trachea normal. No tracheal tenderness present. No tracheal deviation present. No thyromegaly present.  Cardiovascular: Normal rate, regular rhythm, S1 normal, S2 normal and normal heart sounds.   No murmur heard. Pulmonary/Chest: Breath sounds normal. No stridor. No respiratory distress. He has no wheezes. He has no rales.  Musculoskeletal: He exhibits no edema.  Lymphadenopathy:       Head (right side): No tonsillar adenopathy present.       Head (left side): No tonsillar adenopathy present.    He has no cervical adenopathy.  Neurological: He is alert. Gait normal.  Skin: No rash noted. He is not diaphoretic. No erythema. Nails show no clubbing.  Psychiatric: Mood and affect normal.    Diagnostics: None  Assessment and Plan:   1. Allergic urticaria   2. Allergic rhinoconjunctivitis   3. Dry eye syndrome, bilateral      1. Stay away from all antihistamines  2. Continue Doxycycline 100mg  one tablet one time per day  3. OTC Rhinocort one spray each nostril one time per day  4. Continue Restasis eye drops and SYSTANE eye drops  5.  Continue Xolair Monthly (and Epi-Pen)  6. Can consider starting immunotherapy  7. Return in late Summer 2017 or earlier if problem   Brax is doing relatively well at this point in time and we'll continue to have him use doxycycline and continue to have him use Xolair and  anti-inflammatory medications for his upper respiratory tract as noted above. Given his dry eye syndrome he really needs to remain away from antihistamines as much as possible. He is interested in undergoing a course of immunotherapy and is presently considering this option. I will see him back in this clinic in the summer of 2017 or earlier if problem.  Allena Katz, MD [place]

## 2015-12-21 NOTE — Patient Instructions (Signed)
  1. Stay away from all antihistamines  2. Continue Doxycycline 100mg  one tablet one time per day  3. OTC Rhinocort one spray each nostril one time per day  4. Continue Restasis eye drops and SYSTANE eye drops  5.  Continue Xolair Monthly (and Epi-Pen)  6. Can consider starting immunotherapy  7. Return in late Summer 2017 or earlier if problem

## 2015-12-24 ENCOUNTER — Telehealth: Payer: Self-pay

## 2015-12-24 ENCOUNTER — Ambulatory Visit (INDEPENDENT_AMBULATORY_CARE_PROVIDER_SITE_OTHER): Payer: PPO | Admitting: Emergency Medicine

## 2015-12-24 VITALS — BP 126/82 | HR 86 | Temp 98.0°F | Resp 18 | Ht 67.0 in | Wt 145.0 lb

## 2015-12-24 DIAGNOSIS — R739 Hyperglycemia, unspecified: Secondary | ICD-10-CM

## 2015-12-24 DIAGNOSIS — D473 Essential (hemorrhagic) thrombocythemia: Secondary | ICD-10-CM | POA: Diagnosis not present

## 2015-12-24 DIAGNOSIS — F411 Generalized anxiety disorder: Secondary | ICD-10-CM | POA: Diagnosis not present

## 2015-12-24 DIAGNOSIS — N529 Male erectile dysfunction, unspecified: Secondary | ICD-10-CM | POA: Insufficient documentation

## 2015-12-24 DIAGNOSIS — J301 Allergic rhinitis due to pollen: Secondary | ICD-10-CM | POA: Diagnosis not present

## 2015-12-24 DIAGNOSIS — E291 Testicular hypofunction: Secondary | ICD-10-CM | POA: Diagnosis not present

## 2015-12-24 DIAGNOSIS — E349 Endocrine disorder, unspecified: Secondary | ICD-10-CM

## 2015-12-24 DIAGNOSIS — I459 Conduction disorder, unspecified: Secondary | ICD-10-CM | POA: Diagnosis not present

## 2015-12-24 DIAGNOSIS — N528 Other male erectile dysfunction: Secondary | ICD-10-CM

## 2015-12-24 DIAGNOSIS — D75839 Thrombocytosis, unspecified: Secondary | ICD-10-CM

## 2015-12-24 MED ORDER — SILDENAFIL CITRATE 20 MG PO TABS: ORAL_TABLET | ORAL | Status: DC

## 2015-12-24 NOTE — Telephone Encounter (Signed)
Pt was checking on this status of his pre authorization from [employer] on [employer] for the medication Lisinopril.  Please advise  [phone]

## 2015-12-24 NOTE — Progress Notes (Signed)
Patient ID: Jeremy Villa, male   DOB: August 13, 1991, 25 y.o.   MRN: [id number]     By signing my name below, I, Zola Button, attest that this documentation has been prepared under the direction and in the presence of Arlyss Queen, MD.  Electronically Signed: Zola Button, Medical Scribe. 12/24/2015. 1:17 PM.   Chief Complaint:  Chief Complaint  Patient presents with  . other    pt needs medication for testosterone  . other    pre- authorization for medication, medication refill cialis    HPI: Jeremy Villa is a 25 y.o. male who reports to [facility] today to discuss his medications.  Patient is on escitalopram for depression, which he states caused decreased libido. He was prescribed Cialis for this, but he is thinking about starting testosterone replacement. He is followed by Dr. Neldon Mc at the [facility], who he last saw 3 days ago. He does not have any dietary restrictions. Patient became ill with a fever a few weeks ago and believes he has not completely recovered yet. He notes his calcium levels have been high for the past 2 years, which he thinks could be related to his medications.  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 He is originally from [place]. He is out of class until May.  Past Medical History  Diagnosis Date  . Urticaria   . Depression   . Allergic rhinoconjunctivitis   . Allergy   . Anxiety   . Neuromuscular disorder [facility])    Past Surgical History  Procedure Laterality Date  . Lasik    . Eye surgery     Social History   Social History  . Marital Status: Single    Spouse Name: N/A  . Number of Children: N/A  . Years of Education: N/A   Social History Main Topics  . Smoking status: Former Smoker    Quit date: 08/25/2014  . Smokeless tobacco: Never Used  . Alcohol Use: No  . Drug Use: No  . Sexual Activity: Not Asked   Other Topics Concern  . None   Social History Narrative   Family History    Problem Relation Age of Onset  . Diabetes Mother   . High blood pressure Father    Allergies  Allergen Reactions  . Tree Extract Itching  . Wheat Bran Itching   Prior to Admission medications   Medication Sig Start Date End Date Taking? Authorizing Provider  B Complex Vitamins (VITAMIN B COMPLEX PO) Take by mouth daily.   Yes Historical Provider, MD  budesonide (RHINOCORT ALLERGY) 32 MCG/ACT nasal spray Place 1 spray into both nostrils daily.   Yes Historical Provider, MD  Cholecalciferol (VITAMIN D PO) Take by mouth daily.   Yes Historical Provider, MD  CIALIS 5 MG tablet TK 1 T PO QD 11/05/15  Yes Historical Provider, MD  cycloSPORINE (RESTASIS) 0.05 % ophthalmic emulsion 1 drop.   Yes Historical Provider, MD  doxycycline (VIBRAMYCIN) 100 MG capsule Take 1 capsule (100 mg total) by mouth daily. 09/28/15  Yes Jiles Prows, MD  escitalopram (LEXAPRO) 10 MG tablet Reported on 12/21/2015 12/08/15  Yes Historical Provider, MD  escitalopram (LEXAPRO) 20 MG tablet Take 20 mg by mouth daily.   Yes Historical Provider, MD  eszopiclone (LUNESTA) 2 MG TABS tablet TK 1 T PO QHS FOR SLEEP 11/04/15  Yes Historical Provider, MD  fexofenadine (ALLEGRA) 180 MG tablet Take 180 mg by mouth. Reported  on 09/28/2015   Yes Historical Provider, MD  ketotifen (ZADITOR) 0.025 % ophthalmic solution 1 drop. Reported on 09/28/2015   Yes Historical Provider, MD  methocarbamol (ROBAXIN) 500 MG tablet  12/20/15  Yes Historical Provider, MD  methocarbamol (ROBAXIN) 750 MG tablet TK 1 T PO Q 6 H PRF SPASMS 10/22/15  Yes Historical Provider, MD  naproxen (NAPROSYN) 500 MG tablet Take 1 tablet (500 mg total) by mouth 2 (two) times daily. 12/20/15  Yes Etta Quill, NP  Olopatadine HCl (PAZEO) 0.7 % SOLN Apply 1 drop to eye daily as needed (for itchy eyes.). 09/28/15  Yes Jiles Prows, MD  VITAMIN A PO Take by mouth daily.   Yes Historical Provider, MD  VITAMIN E PO Take by mouth daily.   Yes Historical Provider, MD  amoxicillin  (AMOXIL) 500 MG capsule Reported on 12/24/2015 12/14/15   Historical Provider, MD  Ascorbic Acid (VITAMIN C PO) Take by mouth daily. Reported on 12/24/2015    Historical Provider, MD  EPINEPHrine (EPIPEN 2-PAK) 0.3 mg/0.3 mL IJ SOAJ injection Inject into the muscle. Reported on 12/24/2015    Historical Provider, MD  Payton Mccallum Reported on 12/24/2015 07/30/15   Historical Provider, MD  Olopatadine HCl 0.6 % SOLN Place into the nose at bedtime. Reported on 12/24/2015    Historical Provider, MD  Polyvinyl Alcohol-Povidone ([place] OP) Apply to eye as needed. Reported on 12/24/2015    Historical Provider, MD  POTASSIUM PO Take by mouth daily. Reported on 12/24/2015    Historical Provider, MD     ROS: The patient denies fevers, chills, night sweats, unintentional weight loss, chest pain, palpitations, wheezing, dyspnea on exertion, nausea, vomiting, abdominal pain, dysuria, hematuria, melena, numbness, weakness, or tingling.  All other systems have been reviewed and were otherwise negative with the exception of those mentioned in the HPI and as above.    PHYSICAL EXAM: Filed Vitals:   12/24/15 1218  BP: 126/82  Pulse: 86  Temp: 98 F (36.7 C)  Resp: 18   Body mass index is 22.71 kg/(m^2).   General: Alert, no acute distress HEENT:  Normocephalic, atraumatic, oropharynx patent. Eye: Juliette Mangle [facility] Cardiovascular:  Regular rate. Occasional skip beat. No Carotid bruits, radial pulse intact. No pedal edema.  Respiratory: Clear to auscultation bilaterally.  No wheezes, rales, or rhonchi.  No cyanosis, no use of accessory musculature Abdominal: No organomegaly, abdomen is soft and non-tender, positive bowel sounds.  No masses. Musculoskeletal: Gait intact. No edema, tenderness Skin: No rashes. Neurologic: Facial musculature symmetric. Psychiatric: Patient acts appropriately throughout our interaction. Lymphatic: No cervical or submandibular lymphadenopathy    LABS:    EKG/XRAY:   Primary read  interpreted by Dr. Everlene Farrier at [facility]. EKG normal sinus rhythm.   ASSESSMENT/PLAN: Patient suffers from anxiety which he says is better on Lexapro. He has been taking a lot of supplements. He brings lab work today which is a calcium of 10.3 testosterone level of 329 hemoglobin A1c of 6 and a platelet count of 454,000. His thyroid studies normal. I thought the most reassuring way to proceed would be to have him see Dr. Loanne Drilling to review his high calcium low testosterone and mild elevation in sugar to see if any other evaluation is indicated. I did an EKG because he had a few skipped beats when I examined him but his EKG is normal. Patient was comfortable with this approach and will follow-up with Dr. Loanne Drilling.  Gross sideeffects, risk and benefits, and alternatives of medications d/w patient. Patient  is aware that all medications have potential sideeffects and we are unable to predict every sideeffect or drug-drug interaction that may occur.  Arlyss Queen MD 12/24/2015 1:17 PM

## 2015-12-24 NOTE — Patient Instructions (Addendum)
EKG is normal. I have made a referral for you to see the endocrinologist to evaluate you for your low testosterone, elevated glucose, and elevated calcium. I have given you a prescription for sildenafil to take as needed for erectile dysfunction.   IF you received an x-ray today, you will receive an invoice from [facility]. Please contact [facility] at [phone] with questions or concerns regarding your invoice.   IF you received labwork today, you will receive an invoice from [employer]. Please contact [facility] at [phone] with questions or concerns regarding your invoice.   Our billing staff will not be able to assist you with questions regarding bills from these companies.  You will be contacted with the lab results as soon as they are available. The fastest way to get your results is to activate your My Chart account. Instructions are located on the last page of this paperwork. If you have not heard from [place] regarding the results in 2 weeks, please contact this office.

## 2015-12-27 NOTE — Telephone Encounter (Signed)
Revatio

## 2015-12-28 NOTE — Telephone Encounter (Signed)
Spoke to pt who stated that the ins told him that if we sent in a PA stating that pt's ED is due to SE of his anti depressant med, they would cover the sildenafil. I completed PA stating this on covermymeds. Pending.

## 2016-01-03 ENCOUNTER — Ambulatory Visit (INDEPENDENT_AMBULATORY_CARE_PROVIDER_SITE_OTHER): Payer: PPO

## 2016-01-03 DIAGNOSIS — L500 Allergic urticaria: Secondary | ICD-10-CM

## 2016-01-04 NOTE — Telephone Encounter (Signed)
PA was denied because it is only ever covered for pulmonary arterial HTN. Notified pt and suggested he call different pharm for lowest price and have Rx transferred there. Also, advised pt to call his ins and ask what med is covered and for how many tabs per month and call if that is cheaper. Pt agreed.

## 2016-01-05 ENCOUNTER — Other Ambulatory Visit: Payer: Self-pay | Admitting: Allergy and Immunology

## 2016-04-05 ENCOUNTER — Telehealth: Payer: Self-pay

## 2016-04-05 ENCOUNTER — Ambulatory Visit (INDEPENDENT_AMBULATORY_CARE_PROVIDER_SITE_OTHER): Payer: PPO

## 2016-04-05 DIAGNOSIS — L500 Allergic urticaria: Secondary | ICD-10-CM

## 2016-04-05 NOTE — Telephone Encounter (Signed)
Patient wants to start ITX.  He said this was discussed at last OV.

## 2016-04-05 NOTE — Telephone Encounter (Signed)
Please have him set up date for ITX then we can write scripts. Please inform me when a start date is established.

## 2016-04-05 NOTE — Telephone Encounter (Signed)
Patient wants to start ITX.  He said Dr. Neldon Mc has discussed this with him at his last OV and he is ready.

## 2016-04-07 NOTE — Telephone Encounter (Signed)
I left message for patient to call the office.

## 2016-04-14 NOTE — Telephone Encounter (Signed)
Scheduled start injection appt for Aug 7. Please send script.

## 2016-04-17 ENCOUNTER — Other Ambulatory Visit: Payer: Self-pay | Admitting: Family Medicine

## 2016-04-17 ENCOUNTER — Ambulatory Visit (INDEPENDENT_AMBULATORY_CARE_PROVIDER_SITE_OTHER): Payer: PPO | Admitting: Family Medicine

## 2016-04-17 VITALS — BP 122/80 | HR 84 | Temp 97.9°F | Resp 18 | Ht 67.0 in | Wt 158.0 lb

## 2016-04-17 DIAGNOSIS — N62 Hypertrophy of breast: Secondary | ICD-10-CM | POA: Diagnosis not present

## 2016-04-17 DIAGNOSIS — R635 Abnormal weight gain: Secondary | ICD-10-CM | POA: Diagnosis not present

## 2016-04-17 DIAGNOSIS — T887XXA Unspecified adverse effect of drug or medicament, initial encounter: Secondary | ICD-10-CM | POA: Diagnosis not present

## 2016-04-17 DIAGNOSIS — F429 Obsessive-compulsive disorder, unspecified: Secondary | ICD-10-CM

## 2016-04-17 DIAGNOSIS — IMO0002 Reserved for concepts with insufficient information to code with codable children: Secondary | ICD-10-CM

## 2016-04-17 DIAGNOSIS — T50905A Adverse effect of unspecified drugs, medicaments and biological substances, initial encounter: Secondary | ICD-10-CM

## 2016-04-17 MED ORDER — ESCITALOPRAM OXALATE 5 MG PO TABS: 5.0000 mg | ORAL_TABLET | Freq: Every day | ORAL | 1 refills | Status: DC

## 2016-04-17 NOTE — Patient Instructions (Addendum)
I don't think that major gynecomastia is your problem as much as the weight gain came from being on the Lexapro and from the back in [place] where you ate a lot. You need to do regular exercise and work on eating less. Avoid too much fried food. Decrease the quantity of food. Avoid liquid calories, drinking primarily water.  Decrease the Lexapro in 2 weeks to 5 mg as discussed. If your OCD symptoms get worse you will need to go back up to the last effective dose. If you continue to do well on 5 mg for another month, you can try discontinuing it. However I suspect that you will have more problems with OCD again in the future, and should go back on the last effective dose.  If you keep having symptoms we will need to make a referral to a psychiatrist for you.   Return as needed.    IF you received an x-ray today, you will receive an invoice from [facility]. Please contact [facility] Radiology at [phone] with questions or concerns regarding your invoice.   IF you received labwork today, you will receive an invoice from [employer]. Please contact [facility] at [phone] with questions or concerns regarding your invoice.   Our billing staff will not be able to assist you with questions regarding bills from these companies.  You will be contacted with the lab results as soon as they are available. The fastest way to get your results is to activate your My Chart account. Instructions are located on the last page of this paperwork. If you have not heard from [place] regarding the results in 2 weeks, please contact this office.

## 2016-04-17 NOTE — Progress Notes (Signed)
Patient ID: Jeremy Villa, male    DOB: May 30, 1991  Age: 25 y.o. MRN: [id number]  Chief Complaint  Patient presents with  . Medication Problem    Weight gain, breasts    Subjective:   Patient went back to [place] for month this summer. He gained 12 pounds. He has developed bigger breast tissue and feels like he is gotten gynecomastia from taking the Lexapro. We had a long discussion about his medication. He is hard he cut himself back from 20 mg to 10 mg, and plans to decrease to 5 mg a couple of weeks. He apparently has 5, 10, and 20 mg pills. He says that his OCD is doing much better and he does not need his medications. I tried to explain to him that when he comes off of the medications, he probably will not be cured, and will probably go back to having more OCD issues again. Therefore he needs to think in terms of possibly staying on the medication for long-term.  Current allergies, medications, problem list, past/family and social histories reviewed.  Objective:  BP 122/80 (BP Location: Right Arm, Patient Position: Sitting, Cuff Size: Normal)   Pulse 84   Temp 97.9 F (36.6 C) (Oral)   Resp 18   Ht 5\' 7"  (1.702 m)   Wt 158 lb (71.7 kg)   SpO2 98%   BMI 24.75 kg/m   He has gotten a little overweight. Breasts are moderately large, but I cannot palpate the gynecomastia-like glandular prominence. I think it is more the weight gain causing the fat of the breasts were of than the breast tissue to enlarge.  Assessment & Plan:   Assessment: 1. Adverse effect of drug/medicinal, initial encounter   2. Weight gain due to medication   3. OCD (obsessive compulsive disorder)   4. Breast enlargement       Plan: See instructions.   Patient Instructions   I don't think that major gynecomastia is your problem as much as the weight gain came from being on the Lexapro and from the back in [place] where you ate a lot. You need to do regular exercise and work on eating less. Avoid  too much fried food. Decrease the quantity of food. Avoid liquid calories, drinking primarily water.  Decrease the Lexapro in 2 weeks to 5 mg as discussed. If your OCD symptoms get worse you will need to go back up to the last effective dose. If you continue to do well on 5 mg for another month, you can try discontinuing it. However I suspect that you will have more problems with OCD again in the future, and should go back on the last effective dose.  If you keep having symptoms we will need to make a referral to a psychiatrist for you.   Return as needed.    IF you received an x-ray today, you will receive an invoice from [facility]. Please contact [facility]iology at [phone] with questions or concerns regarding your invoice.   IF you received labwork today, you will receive an invoice from [employer]. Please contact [facility] at [phone] with questions or concerns regarding your invoice.   Our billing staff will not be able to assist you with questions regarding bills from these companies.  You will be contacted with the lab results as soon as they are available. The fastest way to get your results is to activate your My Chart account. Instructions are located on the last page of this  paperwork. If you have not heard from [place] regarding the results in 2 weeks, please contact this office.         No Follow-up on file.   HOPPER,DAVID, MD 04/17/2016

## 2016-04-20 NOTE — Telephone Encounter (Signed)
I can not find skin test results or blood test results to write ITX script. Please look for information. May need to have him get a aero allergen panel and delay ITX until returns.

## 2016-04-20 NOTE — Telephone Encounter (Signed)
Per your note on 12/14 pt was seen at another allergist office in [place], he was tested their and was on immunotherapy. This might of been why we did not test him.  Please advise.

## 2016-04-20 NOTE — Telephone Encounter (Signed)
LM FOR PT TO CALL US BACK

## 2016-04-20 NOTE — Telephone Encounter (Signed)
No history of him having testing done. I talked to [facility] and he has only been seen in epic. I wonder if we even did any testing?   Please advise

## 2016-04-20 NOTE — Telephone Encounter (Signed)
Get aero allergen profile

## 2016-04-20 NOTE — Telephone Encounter (Signed)
Please have him obtain aero 2 aeroallergen profile.

## 2016-04-21 NOTE — Telephone Encounter (Signed)
Lm for pt to call us back  

## 2016-04-24 ENCOUNTER — Other Ambulatory Visit: Payer: Self-pay | Admitting: Allergy and Immunology

## 2016-04-24 DIAGNOSIS — L500 Allergic urticaria: Secondary | ICD-10-CM

## 2016-04-24 NOTE — Telephone Encounter (Signed)
Pt came into clinic today to start injections I informed him of [place] trying to contact him I went ahead and ordered his labs and sent him to the lab. Informed him that we will contact him once we get the results and then dr K will put in orders for pts vials.

## 2016-04-26 LAB — ALLERGENS, ZONE 2
Amer Sycamore IgE Qn: 3.42 kU/L — AB
Aspergillus Fumigatus IgE: <0.1 kU/L
Bahia Grass IgE: 3.05 kU/L — AB
Bermuda Grass IgE: 3.32 kU/L — AB
Cat Dander IgE: <0.1 kU/L
Cedar, Mountain IgE: 3.55 kU/L — AB
Cladosporium Herbarum IgE: <0.1 kU/L
Cockroach, American IgE: 3.41 kU/L — AB
D Farinae IgE: 12 kU/L — AB
D Pteronyssinus IgE: 15.6 kU/L — AB
Dog Dander IgE: 0.15 kU/L — AB
Johnson Grass IgE: 3.18 kU/L — AB
M004-IGE MUCOR RACEMOSUS: 0.46 kU/L — AB
Mugwort IgE Qn: 2.59 kU/L — AB
Ragweed, Short IgE: 3.14 kU/L — AB
Sheep Sorrel IgE Qn: 3.06 kU/L — AB
Sweet gum IgE RAST Ql: 2.65 kU/L — AB
T001-IGE MAPLE/BOX ELDER: 3.14 kU/L — AB
T003-IGE COMMON SILVER BIRCH: 1.84 kU/L — AB
T007-IGE OAK, WHITE: 2.78 kU/L — AB
T008-IGE ELM, AMERICAN: 2.5 kU/L — AB
T041-IGE HICKORY, WHITE: 3.15 kU/L — AB
TIMOTHY IGE: 3.25 kU/L — AB
W009-IGE PLANTAIN, ENGLISH: 2.97 kU/L — AB
W014-IGE PIGWEED, ROUGH: 2.57 kU/L — AB
W020-IGE NETTLE: 2.16 kU/L — AB
White Mulberry IgE: 1.92 kU/L — AB

## 2016-04-27 DIAGNOSIS — J301 Allergic rhinitis due to pollen: Secondary | ICD-10-CM | POA: Diagnosis not present

## 2016-04-28 DIAGNOSIS — J3089 Other allergic rhinitis: Secondary | ICD-10-CM | POA: Diagnosis not present

## 2016-05-03 ENCOUNTER — Other Ambulatory Visit: Payer: Self-pay | Admitting: Allergy and Immunology

## 2016-05-03 DIAGNOSIS — H1010 Acute atopic conjunctivitis, unspecified eye: Secondary | ICD-10-CM

## 2016-05-03 DIAGNOSIS — L500 Allergic urticaria: Secondary | ICD-10-CM

## 2016-05-03 DIAGNOSIS — J309 Allergic rhinitis, unspecified: Principal | ICD-10-CM

## 2016-05-05 ENCOUNTER — Other Ambulatory Visit: Payer: Self-pay | Admitting: Allergy and Immunology

## 2016-05-15 ENCOUNTER — Ambulatory Visit (INDEPENDENT_AMBULATORY_CARE_PROVIDER_SITE_OTHER): Payer: PPO

## 2016-05-15 ENCOUNTER — Telehealth: Payer: Self-pay | Admitting: Allergy and Immunology

## 2016-05-15 DIAGNOSIS — L500 Allergic urticaria: Secondary | ICD-10-CM | POA: Diagnosis not present

## 2016-05-15 NOTE — Telephone Encounter (Signed)
Please inform patient that his blood tests identified very severe allergies against dust mite and grasses and weeds and trees and we have formulated and extract for him to start immunotherapy. The instructions on immunotherapy were to contact patient once they were available for clinic delivery. Has patient been contacted about starting immunotherapy? If not please schedule immunotherapy for him as the extract prescription has been delivered to [place].

## 2016-05-15 NOTE — Telephone Encounter (Signed)
Patient called and would like blood test results. Last seen by Dr. Neldon Mc on 05/03/16. [phone]

## 2016-05-15 NOTE — Telephone Encounter (Signed)
Dr Neldon Mc please advise on patient's lab results

## 2016-05-15 NOTE — Telephone Encounter (Signed)
Patient advised as written below appt made for 05/23/2016 @ 10am to start injections

## 2016-05-23 ENCOUNTER — Ambulatory Visit (INDEPENDENT_AMBULATORY_CARE_PROVIDER_SITE_OTHER): Payer: PPO

## 2016-05-23 ENCOUNTER — Ambulatory Visit: Payer: Self-pay

## 2016-05-23 ENCOUNTER — Ambulatory Visit (INDEPENDENT_AMBULATORY_CARE_PROVIDER_SITE_OTHER): Payer: PPO | Admitting: Family Medicine

## 2016-05-23 VITALS — BP 122/72 | HR 59 | Temp 98.5°F | Resp 17 | Ht 65.5 in | Wt 151.0 lb

## 2016-05-23 DIAGNOSIS — J309 Allergic rhinitis, unspecified: Secondary | ICD-10-CM | POA: Diagnosis not present

## 2016-05-23 DIAGNOSIS — Z113 Encounter for screening for infections with a predominantly sexual mode of transmission: Secondary | ICD-10-CM

## 2016-05-23 DIAGNOSIS — E538 Deficiency of other specified B group vitamins: Secondary | ICD-10-CM | POA: Diagnosis not present

## 2016-05-23 DIAGNOSIS — Z0000 Encounter for general adult medical examination without abnormal findings: Secondary | ICD-10-CM | POA: Diagnosis not present

## 2016-05-23 DIAGNOSIS — F429 Obsessive-compulsive disorder, unspecified: Secondary | ICD-10-CM | POA: Diagnosis not present

## 2016-05-23 DIAGNOSIS — R5382 Chronic fatigue, unspecified: Secondary | ICD-10-CM | POA: Diagnosis not present

## 2016-05-23 DIAGNOSIS — F411 Generalized anxiety disorder: Secondary | ICD-10-CM | POA: Diagnosis not present

## 2016-05-23 DIAGNOSIS — N529 Male erectile dysfunction, unspecified: Secondary | ICD-10-CM | POA: Diagnosis not present

## 2016-05-23 DIAGNOSIS — Z136 Encounter for screening for cardiovascular disorders: Secondary | ICD-10-CM | POA: Diagnosis not present

## 2016-05-23 LAB — CBC WITH DIFFERENTIAL/PLATELET
Basophils Absolute: 0 cells/uL (ref 0–200)
Basophils Relative: 0 %
EOS PCT: 1 %
Eosinophils Absolute: 62 cells/uL (ref 15–500)
HCT: 42.9 % (ref 38.5–50.0)
Hemoglobin: 14.3 g/dL (ref 13.2–17.1)
LYMPHS PCT: 53 %
Lymphs Abs: 3286 cells/uL (ref 850–3900)
MCH: 27.5 pg (ref 27.0–33.0)
MCHC: 33.3 g/dL (ref 32.0–36.0)
MCV: 82.5 fL (ref 80.0–100.0)
MONOS PCT: 6 %
MPV: 9.9 fL (ref 7.5–12.5)
Monocytes Absolute: 372 cells/uL (ref 200–950)
NEUTROS PCT: 40 %
Neutro Abs: 2480 cells/uL (ref 1500–7800)
Platelets: 477 10*3/uL — ABNORMAL HIGH (ref 140–400)
RBC: 5.2 MIL/uL (ref 4.20–5.80)
RDW: 14.1 % (ref 11.0–15.0)
WBC: 6.2 10*3/uL (ref 3.8–10.8)

## 2016-05-23 LAB — POCT URINALYSIS DIP (MANUAL ENTRY)
BILIRUBIN UA: NEGATIVE
Bilirubin, UA: NEGATIVE
Glucose, UA: NEGATIVE
Leukocytes, UA: NEGATIVE
Nitrite, UA: NEGATIVE
PROTEIN UA: NEGATIVE
RBC UA: NEGATIVE
SPEC GRAV UA: 1.02
Urobilinogen, UA: 0.2
pH, UA: 5.5

## 2016-05-23 LAB — COMPLETE METABOLIC PANEL WITH GFR
ALBUMIN: 5 g/dL (ref 3.6–5.1)
ALK PHOS: 52 U/L (ref 40–115)
ALT: 19 U/L (ref 9–46)
AST: 21 U/L (ref 10–40)
BUN: 13 mg/dL (ref 7–25)
CALCIUM: 10.3 mg/dL (ref 8.6–10.3)
CO2: 23 mmol/L (ref 20–31)
CREATININE: 0.84 mg/dL (ref 0.60–1.35)
Chloride: 103 mmol/L (ref 98–110)
GFR, Est African American: >89 mL/min (ref >=60)
GFR, Est Non African American: >89 mL/min (ref >=60)
Glucose, Bld: 87 mg/dL (ref 65–99)
Potassium: 4.8 mmol/L (ref 3.5–5.3)
Sodium: 139 mmol/L (ref 135–146)
Total Bilirubin: 0.6 mg/dL (ref 0.2–1.2)
Total Protein: 7.7 g/dL (ref 6.1–8.1)

## 2016-05-23 LAB — LIPID PANEL
CHOL/HDL RATIO: 4.8 ratio (ref <=5.0)
Cholesterol: 172 mg/dL (ref 125–200)
HDL: 36 mg/dL — AB (ref >=40)
LDL CALC: 121 mg/dL (ref <130)
TRIGLYCERIDES: 76 mg/dL (ref <150)
VLDL: 15 mg/dL (ref <30)

## 2016-05-23 LAB — POC MICROSCOPIC URINALYSIS (UMFC): MUCUS RE: ABSENT

## 2016-05-23 LAB — VITAMIN B12: VITAMIN B 12: 678 pg/mL (ref 200–1100)

## 2016-05-23 MED ORDER — HYDROCORTISONE ACETATE 25 MG RE SUPP: 25.0000 mg | Freq: Two times a day (BID) | RECTAL | 0 refills | Status: DC

## 2016-05-23 NOTE — Progress Notes (Signed)
Immunotherapy   Patient Details  Name: Jeremy Villa MRN: [id number] Date of Birth: [birth year]  05/23/2016  Jeremy Villa  STARTED INJECTIONS ON BLUE Following schedule: B  Frequency:1-2 TIMES PER WEEK Epi-Pen:YES Consent signed and patient instructions given.   Jeremy Villa 05/23/2016, 10:45 AM

## 2016-05-23 NOTE — Patient Instructions (Addendum)
I will follow-up with you regarding your lab results.  Return for follow-up in 6 months to discuss discontinuing escitalopram.  Carroll Sage. Kenton Kingfisher, MSN, FNP-C [facility] [place]    IF you received an x-ray today, you will receive an invoice from [facility]. Please contact [facility] Radiology at [phone] with questions or concerns regarding your invoice.   IF you received labwork today, you will receive an invoice from [employer]. Please contact [facility] at [phone] with questions or concerns regarding your invoice.   Our billing staff will not be able to assist you with questions regarding bills from these companies.  You will be contacted with the lab results as soon as they are available. The fastest way to get your results is to activate your My Chart account. Instructions are located on the last page of this paperwork. If you have not heard from [place] regarding the results in 2 weeks, please contact this office.     Exercising to Stay Healthy Exercising regularly is important. It has many health benefits, such as:  Improving your overall fitness, flexibility, and endurance.  Increasing your bone density.  Helping with weight control.  Decreasing your body fat.  Increasing your muscle strength.  Reducing stress and tension.  Improving your overall health. In order to become healthy and stay healthy, it is recommended that you do moderate-intensity and vigorous-intensity exercise. You can tell that you are exercising at a moderate intensity if you have a higher heart rate and faster breathing, but you are still able to hold a conversation. You can tell that you are exercising at a vigorous intensity if you are breathing much harder and faster and cannot hold a conversation while exercising. HOW OFTEN SHOULD I EXERCISE? Choose an activity that you enjoy and set realistic goals. Your health care  provider can help you to make an activity plan that works for you. Exercise regularly as directed by your health care provider. This may include:   Doing resistance training twice each week, such as:  Push-ups.  Sit-ups.  Lifting weights.  Using resistance bands.  Doing a given intensity of exercise for a given amount of time. Choose from these options:  150 minutes of moderate-intensity exercise every week.  75 minutes of vigorous-intensity exercise every week.  A mix of moderate-intensity and vigorous-intensity exercise every week. Children, pregnant women, people who are out of shape, people who are overweight, and older adults may need to consult a health care provider for individual recommendations. If you have any sort of medical condition, be sure to consult your health care provider before starting a new exercise program.  WHAT ARE SOME EXERCISE IDEAS? Some moderate-intensity exercise ideas include:   Walking at a rate of 1 mile in 15 minutes.  Biking.  Hiking.  Golfing.  Dancing. Some vigorous-intensity exercise ideas include:   Walking at a rate of at least 4.5 miles per hour.  Jogging or running at a rate of 5 miles per hour.  Biking at a rate of at least 10 miles per hour.  Lap swimming.  Roller-skating or in-line skating.  Cross-country skiing.  Vigorous competitive sports, such as football, basketball, and soccer.  Jumping rope.  Aerobic dancing. WHAT ARE SOME EVERYDAY ACTIVITIES THAT CAN HELP ME TO GET EXERCISE?  Yard work, such as:  Psychologist, educational.  Raking and bagging leaves.  Washing and waxing your car.  Pushing a stroller.  Shoveling snow.  Gardening.  Washing windows or floors. HOW CAN I BE MORE ACTIVE IN MY DAY-TO-DAY ACTIVITIES?  Use the stairs instead of the elevator.  Take a walk during your lunch break.  If you drive, park your car farther away from work or school.  If you take public transportation, get off  one stop early and walk the rest of the way.  Make all of your phone calls while standing up and walking around.  Get up, stretch, and walk around every 30 minutes throughout the day. WHAT GUIDELINES SHOULD I FOLLOW WHILE EXERCISING?  Do not exercise so much that you hurt yourself, feel dizzy, or get very short of breath.  Consult your health care provider before starting a new exercise program.  Wear comfortable clothes and shoes with good support.  Drink plenty of water while you exercise to prevent dehydration or heat stroke. Body water is lost during exercise and must be replaced.  Work out until you breathe faster and your heart beats faster.   This information is not intended to replace advice given to you by your health care provider. Make sure you discuss any questions you have with your health care provider.   Document Released: 10/07/2010 Document Revised: 09/25/2014 Document Reviewed: 02/05/2014 Elsevier Interactive Patient Education [employer].

## 2016-05-23 NOTE — Progress Notes (Signed)
Patient ID: Jeremy Villa, male    DOB: [birth year], 25 y.o.   MRN: [id number]  PCP: No PCP Per Patient  Chief Complaint  Patient presents with  . Other    blood work     Subjective:   HPI 25 year old male presents today requesting a complete annual exam. He specifically requests testing of his hearing and he would like his testosterone checked as he comments that it was low several months ago.  He reports that he is in general good health however sometimes he feels a little fatigued over the last few months he was diagnosed with depression over one year ago and states that he would like to set a goal to be taken off of his escitalopram within 6 months as he will be getting married and he feels that the medication is causing him to experience erectile dysfunction.  He is also requesting a vitamin D level but denies any associated symptoms except for fatigue. He would also like to be screened for STDs.  He reports receiving dental checkups and her routine eye exams.  He reports regular and routine exercise at the gym. He that he eats a primarily protein only diet.   . Social History   Social History  . Marital status: Married    Spouse name: N/A  . Number of children: N/A  . Years of education: N/A   Occupational History  . Not on file.   Social History Main Topics  . Smoking status: Former Smoker    Quit date: 08/25/2014  . Smokeless tobacco: Never Used  . Alcohol use No  . Drug use: No  . Sexual activity: Not on file   Other Topics Concern  . Not on file   Social History Narrative  . No narrative on file    . Family History  Problem Relation Age of Onset  . Diabetes Mother   . High blood pressure Father     Review of Systems  Constitutional: Positive for fatigue. Negative for unexpected weight change.  HENT: Negative.   Eyes: Negative.   Respiratory: Negative.   Cardiovascular: Negative.   Gastrointestinal: Negative.   Genitourinary:       Erectile  dysfunction. See history of present illness  Skin: Negative.   Neurological: Negative.        Patient Active Problem List   Diagnosis Date Noted  . OCD (obsessive compulsive disorder) 04/17/2016  . Hypogonadism in male 12/24/2015  . Generalized anxiety disorder 12/24/2015  . Erectile dysfunction 12/24/2015  . Allergic urticaria 09/01/2015  . Allergic rhinoconjunctivitis 09/01/2015  . Dry eye syndrome 09/01/2015     Prior to Admission medications   Medication Sig Start Date End Date Taking? Authorizing Provider  Ascorbic Acid (VITAMIN C PO) Take by mouth daily. Reported on 12/24/2015   Yes Historical Provider, MD  B Complex Vitamins (VITAMIN B COMPLEX PO) Take by mouth daily.   Yes Historical Provider, MD  budesonide (RHINOCORT ALLERGY) 32 MCG/ACT nasal spray Place 1 spray into both nostrils daily.   Yes Historical Provider, MD  cycloSPORINE (RESTASIS) 0.05 % ophthalmic emulsion 1 drop.   Yes Historical Provider, MD  doxycycline (VIBRAMYCIN) 100 MG capsule TAKE 1 CAPSULE(100 MG) BY MOUTH DAILY 01/06/16  Yes Jiles Prows, MD  EPINEPHrine (EPIPEN 2-PAK) 0.3 mg/0.3 mL IJ SOAJ injection Inject into the muscle. Reported on 12/24/2015   Yes Historical Provider, MD  escitalopram (LEXAPRO) 5 MG tablet Take 1 tablet (5 mg total) by mouth daily. 04/17/16  Yes  Posey Boyer, MD  eszopiclone (LUNESTA) 2 MG TABS tablet TK 1 T PO QHS FOR SLEEP 11/04/15  Yes Historical Provider, MD  fexofenadine (ALLEGRA) 180 MG tablet Take 180 mg by mouth. Reported on 09/28/2015   Yes Historical Provider, MD  FLUVIRIN SUSP Reported on 12/24/2015 07/30/15  Yes Historical Provider, MD  ketotifen (ZADITOR) 0.025 % ophthalmic solution 1 drop. Reported on 09/28/2015   Yes Historical Provider, MD  methocarbamol (ROBAXIN) 500 MG tablet  12/20/15  Yes Historical Provider, MD  methocarbamol (ROBAXIN) 750 MG tablet TK 1 T PO Q 6 H PRF SPASMS 10/22/15  Yes Historical Provider, MD  naproxen (NAPROSYN) 500 MG tablet Take 1 tablet (500 mg  total) by mouth 2 (two) times daily. 12/20/15  Yes Etta Quill, NP  Olopatadine HCl (PAZEO) 0.7 % SOLN Apply 1 drop to eye daily as needed (for itchy eyes.). 09/28/15  Yes Jiles Prows, MD  Olopatadine HCl 0.6 % SOLN Place into the nose at bedtime. Reported on 12/24/2015   Yes Historical Provider, MD  Polyvinyl Alcohol-Povidone ([place] OP) Apply to eye as needed. Reported on 12/24/2015   Yes Historical Provider, MD  POTASSIUM PO Take by mouth daily. Reported on 12/24/2015   Yes Historical Provider, MD  sildenafil (REVATIO) 20 MG tablet 1-2 tablets one hour prior to intercourse. Do not take nitroglycerin with this product. 12/24/15  Yes Darlyne Russian, MD  VITAMIN A PO Take by mouth daily.   Yes Historical Provider, MD  VITAMIN E PO Take by mouth daily.   Yes Historical Provider, MD     Allergies  Allergen Reactions  . Tree Extract Itching  . Wheat Bran Itching       Objective:  Physical Exam  Constitutional: He is oriented to person, place, and time. He appears well-developed and well-nourished.  HENT:  Head: Normocephalic and atraumatic.  Right Ear: External ear normal.  Left Ear: External ear normal.  Mouth/Throat: Oropharynx is clear and moist.  Eyes: Conjunctivae and EOM are normal. Pupils are equal, round, and reactive to light.  Cardiovascular: Normal rate, regular rhythm, normal heart sounds and intact distal pulses.   Pulmonary/Chest: Effort normal and breath sounds normal.  Abdominal: Soft. Bowel sounds are normal.  Musculoskeletal: Normal range of motion.  Neurological: He is alert and oriented to person, place, and time. He has normal reflexes.  Skin: Skin is warm and dry.  Psychiatric: He has a normal mood and affect. His behavior is normal. Thought content normal.   . Vitals:   05/23/16 0858  BP: 122/72  Pulse: (!) 59  Resp: 17  Temp: 98.5 F (36.9 C)     Assessment & Plan:  .1. Annual physical exam Age-appropriate anticipatory guidance provided.  2. Screening for  cardiovascular condition - Lipid panel  3. Dietary B12 deficiency - Vitamin B12  4. Chronic fatigue - VITAMIN D 25 Hydroxy (Vit-D Deficiency, Fractures) - POCT urinalysis dipstick - POCT Microscopic Urinalysis ([facility]) - COMPLETE METABOLIC PANEL WITH GFR - CBC with Differential/Platelet  5. Erectile dysfunction, unspecified erectile dysfunction type - Testosterone Total,Free,Bio, Males -Currently weaning self off of escitalopram.  He is only taking 5 mg now compared to 10 mg.   Patient feels this medication is  the underlying cause of his ED.  He takes the medication for OCD and anxiety disorder.  6. Screen for STD (sexually transmitted disease) - HIV antibody (with reflex) - HSV(herpes simplex vrs) 1+2 ab-IgG - GC/Chlamydia Probe Amp  7. Generalized Anxiety Disorder, controlled -Continue  escitalopram 5 mg  8. OCD (obsessive compulsive disorder) , controlled  --Continue escitalopram 5 mg  Will follow-up with lab results.  Carroll Sage. Kenton Kingfisher, MSN, FNP-C [facility]

## 2016-05-24 LAB — TESTOSTERONE TOTAL,FREE,BIO, MALES
ALBUMIN: 5 g/dL (ref 3.6–5.1)
Sex Hormone Binding: 28 nmol/L (ref 10–50)
TESTOSTERONE: 522 ng/dL (ref 250–827)
Testosterone, Bioavailable: 177.7 ng/dL (ref 130.5–681.7)
Testosterone, Free: 78.1 pg/mL (ref 47.0–244.0)

## 2016-05-24 LAB — VITAMIN D 25 HYDROXY (VIT D DEFICIENCY, FRACTURES): VIT D 25 HYDROXY: 33 ng/mL (ref 30–100)

## 2016-05-24 LAB — HSV(HERPES SIMPLEX VRS) I + II AB-IGG
HSV 1 Glycoprotein G Ab, IgG: 9.46 Index — ABNORMAL HIGH (ref <0.90)
HSV 2 Glycoprotein G Ab, IgG: <0.9 Index (ref <0.90)

## 2016-05-24 LAB — GC/CHLAMYDIA PROBE AMP
CT Probe RNA: NOT DETECTED
GC Probe RNA: NOT DETECTED

## 2016-05-24 LAB — HIV ANTIBODY (ROUTINE TESTING W REFLEX): HIV: NONREACTIVE

## 2016-05-25 ENCOUNTER — Ambulatory Visit (INDEPENDENT_AMBULATORY_CARE_PROVIDER_SITE_OTHER): Payer: PPO

## 2016-05-25 DIAGNOSIS — J309 Allergic rhinitis, unspecified: Secondary | ICD-10-CM

## 2016-05-29 ENCOUNTER — Ambulatory Visit (INDEPENDENT_AMBULATORY_CARE_PROVIDER_SITE_OTHER): Payer: PPO | Admitting: Family Medicine

## 2016-05-29 VITALS — BP 114/60 | HR 61 | Temp 98.3°F | Resp 18 | Ht 65.5 in | Wt 151.6 lb

## 2016-05-29 DIAGNOSIS — Z712 Person consulting for explanation of examination or test findings: Secondary | ICD-10-CM

## 2016-05-29 DIAGNOSIS — Z23 Encounter for immunization: Secondary | ICD-10-CM | POA: Diagnosis not present

## 2016-05-29 DIAGNOSIS — F411 Generalized anxiety disorder: Secondary | ICD-10-CM

## 2016-05-29 DIAGNOSIS — K602 Anal fissure, unspecified: Secondary | ICD-10-CM

## 2016-05-29 DIAGNOSIS — Z7189 Other specified counseling: Secondary | ICD-10-CM

## 2016-05-29 MED ORDER — LIDOCAINE (ANORECTAL) 5 % EX GEL: CUTANEOUS | 0 refills | Status: DC

## 2016-05-29 MED ORDER — DILTIAZEM GEL 2 %: CUTANEOUS | 0 refills | Status: DC

## 2016-05-29 NOTE — Progress Notes (Deleted)
Depression screen [facility] 2/9 05/29/2016 12/24/2015  Decreased Interest 1 0  Down, Depressed, Hopeless 1 0  PHQ - 2 Score 2 0  Altered sleeping 2 -  Tired, decreased energy 2 -  Change in appetite 1 -  Feeling bad or failure about yourself  1 -  Trouble concentrating 3 -  Moving slowly or fidgety/restless 3 -  Suicidal thoughts 0 -  PHQ-9 Score 14 -  Difficult doing work/chores Very difficult -    Patient has a rectal teal distal to anal opening. Erythematous. Tender. Negative for exudate.

## 2016-05-29 NOTE — Patient Instructions (Signed)
Use lidocaine 2% ointment and Cardizem 2% ointment TID. Use lidocaine 2% at least 5 minutes before defecation. Increase fiber intake and drink at least 6-8 glasses per day.  Reflux symptoms may use OTC ranitidine or omeprazole.  Feel free to return to have reflux symptoms re-evaluated.   Genital Herpes Genital herpes is a common sexually transmitted infection (STI) that is caused by a virus. The virus is spread from person to person through sexual contact. Infection can cause itching, blisters, and sores in the genital area or rectal area. This is called an outbreak. It affects both men and women. Genital herpes is particularly concerning for pregnant women because the virus can be passed to the baby during delivery and cause serious problems. Genital herpes is also a concern for people with a weakened defense (immune) system. Symptoms of genital herpes may last several days and then go away. However, the virus remains in your body, so you may have more outbreaks of symptoms in the future. The time between outbreaks varies and can be months or years. CAUSES Genital herpes is caused by a virus called herpes simplex virus (HSV) type 2 or HSV type 1. These viruses are contagious and are most often spread through sexual contact with an infected person. Sexual contact includes vaginal, anal, and oral sex. RISK FACTORS Risk factors for genital herpes include:  Being sexually active with multiple partners.  Having unprotected sex. SIGNS AND SYMPTOMS Symptoms may include:  Pain and itching in the genital area or rectal area.  Small red bumps that turn into blisters and then turn into sores.  Flu-like symptoms, including:  Fever.  Body aches.  Painful urination.  Vaginal discharge. DIAGNOSIS Genital herpes may be diagnosed by:  Physical exam.  Blood test.  Fluid culture test from an open sore. TREATMENT There is no cure for genital herpes. Oral antiviral medicines may be used to  speed up healing and to help prevent the return of symptoms. These medicines can also help to reduce the spread of the virus to sexual partners. HOME CARE INSTRUCTIONS  Keep the affected areas dry and clean.  Take medicines only as directed by your health care provider.  Do not have sexual contact during active infections. Genital herpes is contagious.  Practice safe sex. Latex condoms and male condoms may help to prevent the spread of the herpes virus.  Avoid rubbing or touching the blisters and sores. If you do touch the blister or sores:  Wash your hands thoroughly.  Do not touch your eyes afterward.  If you become pregnant, tell your health care provider if you have had genital herpes.  Keep all follow-up visits as directed by your health care provider. This is important. PREVENTION  Use condoms. Although anyone can contract genital herpes during sexual contact even with the use of a condom, a condom can provide some protection.  Avoid having multiple sexual partners.  Talk to your sexual partner about any symptoms and past history that either of you may have.  Get tested before you have sex. Ask your partner to do the same.  Recognize the symptoms of genital herpes. Do not have sexual contact if you notice these symptoms. SEEK MEDICAL CARE IF:  Your symptoms are not improving with medicine.  Your symptoms return.  You have new symptoms.  You have a fever.  You have abdominal pain.  You have redness, swelling, or pain in your eye. MAKE SURE YOU:  Understand these instructions.  Will watch your condition.  Will get help right away if you are not doing well or get worse.   This information is not intended to replace advice given to you by your health care provider. Make sure you discuss any questions you have with your health care provider.   Document Released: 09/01/2000 Document Revised: 09/25/2014 Document Reviewed: 01/20/2014 Elsevier Interactive Patient  Education 2016 [place] Fissure, Adult An anal fissure is a small tear or crack in the skin around the anus. Bleeding from a fissure usually stops on its own within a few minutes. However, bleeding will often occur again with each bowel movement until the crack heals. CAUSES This condition may be caused by:  Passing large, hard stool (feces).  Frequent diarrhea.  Constipation.  Inflammatory bowel disease (Crohn disease or ulcerative colitis).  Infections.  Anal sex. SYMPTOMS Symptoms of this condition include:  Bleeding from the rectum.  Small amounts of blood seen on your stool, on toilet paper, or in the toilet after a bowel movement.  Painful bowel movements.  Itching or irritation around the anus. DIAGNOSIS A health care provider may diagnose this condition by closely examining the anal area. An anal fissure can usually be seen with careful inspection. In some cases, a rectal exam may be performed, or a short tube (anoscope) may be used to examine the anal canal. TREATMENT Treatment for this condition may include:  Taking steps to avoid constipation. This may include making changes to your diet, such as increasing your intake of fiber or fluid.  Taking fiber supplements. These supplements can soften your stool to help make bowel movements easier. Your health care provider may also prescribe a stool softener if your stool is often hard.  Taking sitz baths. This may help to heal the tear.  Using medicated creams or ointments. These may be prescribed to lessen discomfort. HOME CARE INSTRUCTIONS Eating and Drinking  Avoid foods that may be constipating, such as bananas and dairy products.  Drink enough fluid to keep your urine clear or pale yellow.  Maintain a diet that is high in fruits, whole grains, and vegetables. General Instructions  Keep the anal area as clean and dry as possible.  Take sitz baths as told by your health care provider. Do not use  soap in the sitz baths.  Take over-the-counter and prescription medicines only as told by your health care provider.  Use creams or ointments only as told by your health care provider.  Keep all follow-up visits as told by your health care provider. This is important. SEEK MEDICAL CARE IF:  You have more bleeding.  You have a fever.  You have diarrhea that is mixed with blood.  You continue to have pain.  Your problem is getting worse rather than better.   This information is not intended to replace advice given to you by your health care provider. Make sure you discuss any questions you have with your health care provider.   Document Released: 09/04/2005 Document Revised: 05/26/2015 Document Reviewed: 11/30/2014 Elsevier Interactive Patient Education [employer].

## 2016-05-31 ENCOUNTER — Ambulatory Visit (INDEPENDENT_AMBULATORY_CARE_PROVIDER_SITE_OTHER): Payer: PPO | Admitting: *Deleted

## 2016-05-31 DIAGNOSIS — J309 Allergic rhinitis, unspecified: Secondary | ICD-10-CM

## 2016-06-02 ENCOUNTER — Ambulatory Visit (INDEPENDENT_AMBULATORY_CARE_PROVIDER_SITE_OTHER): Payer: PPO

## 2016-06-02 DIAGNOSIS — J309 Allergic rhinitis, unspecified: Secondary | ICD-10-CM | POA: Diagnosis not present

## 2016-06-02 NOTE — Progress Notes (Signed)
Patient ID: Jeremy Villa, male    DOB: 1991/06/18, 25 y.o.   MRN: [id number]  PCP: No PCP Per Patient  Chief Complaint  Patient presents with  . Follow-up    for test results  . Immunizations    flu shot   . Depression    PHQ 9 score was 14    Subjective:   HPI 25 year old male seen today to discuss recent lab results and requests evaluation of anal fissure.   Lab results Patient brought in labs results from another practice to compare with recent labs results from complete physical exam on 05/23/16 here are [facility].   Anal Fissure Patient requests a rectal exam.  He feels that his hemorrhoid has resolve but feels he has a wound externally on his rectal opening.  Depression Patient reports ongoing depression. He reports at this time he has decided to continue escitalopram 10 mg for symptoms and will try to wean off dose prior to his wedding next year.  . Social History   Social History  . Marital status: Married    Spouse name: N/A  . Number of children: N/A  . Years of education: N/A   Occupational History  . Not on file.   Social History Main Topics  . Smoking status: Former Smoker    Quit date: 08/25/2014  . Smokeless tobacco: Never Used  . Alcohol use No  . Drug use: No  . Sexual activity: Not on file   Other Topics Concern  . Not on file   Social History Narrative  . No narrative on file    Family History  Problem Relation Age of Onset  . Diabetes Mother   . High blood pressure Father    Review of Systems See HPI  Patient Active Problem List   Diagnosis Date Noted  . OCD (obsessive compulsive disorder) 04/17/2016  . Hypogonadism in male 12/24/2015  . Generalized anxiety disorder 12/24/2015  . Erectile dysfunction 12/24/2015  . Allergic urticaria 09/01/2015  . Allergic rhinoconjunctivitis 09/01/2015  . Dry eye syndrome 09/01/2015     Prior to Admission medications   Medication Sig Start Date End Date Taking? Authorizing Provider    Ascorbic Acid (VITAMIN C PO) Take by mouth daily. Reported on 12/24/2015   Yes Historical Provider, MD  B Complex Vitamins (VITAMIN B COMPLEX PO) Take by mouth daily.   Yes Historical Provider, MD  budesonide (RHINOCORT ALLERGY) 32 MCG/ACT nasal spray Place 1 spray into both nostrils daily.   Yes Historical Provider, MD  cycloSPORINE (RESTASIS) 0.05 % ophthalmic emulsion 1 drop.   Yes Historical Provider, MD  doxycycline (VIBRAMYCIN) 100 MG capsule TAKE 1 CAPSULE(100 MG) BY MOUTH DAILY 01/06/16  Yes Jiles Prows, MD  EPINEPHrine (EPIPEN 2-PAK) 0.3 mg/0.3 mL IJ SOAJ injection Inject into the muscle. Reported on 12/24/2015   Yes Historical Provider, MD  escitalopram (LEXAPRO) 5 MG tablet Take 1 tablet (5 mg total) by mouth daily. 04/17/16  Yes Posey Boyer, MD  eszopiclone (LUNESTA) 2 MG TABS tablet TK 1 T PO QHS FOR SLEEP 11/04/15  Yes Historical Provider, MD  fexofenadine (ALLEGRA) 180 MG tablet Take 180 mg by mouth. Reported on 09/28/2015   Yes Historical Provider, MD  FLUVIRIN SUSP Reported on 12/24/2015 07/30/15  Yes Historical Provider, MD  ketotifen (ZADITOR) 0.025 % ophthalmic solution 1 drop. Reported on 09/28/2015   Yes Historical Provider, MD  methocarbamol (ROBAXIN) 500 MG tablet  12/20/15  Yes Historical Provider, MD  methocarbamol (ROBAXIN) 750  MG tablet TK 1 T PO Q 6 H PRF SPASMS 10/22/15  Yes Historical Provider, MD  naproxen (NAPROSYN) 500 MG tablet Take 1 tablet (500 mg total) by mouth 2 (two) times daily. 12/20/15  Yes Etta Quill, NP  Olopatadine HCl (PAZEO) 0.7 % SOLN Apply 1 drop to eye daily as needed (for itchy eyes.). 09/28/15  Yes Jiles Prows, MD  Olopatadine HCl 0.6 % SOLN Place into the nose at bedtime. Reported on 12/24/2015   Yes Historical Provider, MD  Polyvinyl Alcohol-Povidone ([place] OP) Apply to eye as needed. Reported on 12/24/2015   Yes Historical Provider, MD  POTASSIUM PO Take by mouth daily. Reported on 12/24/2015   Yes Historical Provider, MD  sildenafil (REVATIO) 20 MG  tablet 1-2 tablets one hour prior to intercourse. Do not take nitroglycerin with this product. 12/24/15  Yes Darlyne Russian, MD  VITAMIN A PO Take by mouth daily.   Yes Historical Provider, MD  VITAMIN E PO Take by mouth daily.   Yes Historical Provider, MD  diltiazem 2 % GEL Apply a pea-sized amount to the affected area 3 times daily. 05/29/16   Sedalia Muta, FNP  Lidocaine, Anorectal, 5 % GEL Apply a pea-sized amount to the affected area 3 times daily. 05/29/16   Sedalia Muta, FNP     Allergies  Allergen Reactions  . Tree Extract Itching  . Wheat Bran Itching       Objective:  Physical Exam  Constitutional: He is oriented to person, place, and time. He appears well-developed and well-nourished.  HENT:  Head: Normocephalic and atraumatic.  Right Ear: External ear normal.  Left Ear: External ear normal.  Nose: Nose normal.  Eyes: Conjunctivae and EOM are normal. Pupils are equal, round, and reactive to light.  Neck: Normal range of motion.  Cardiovascular: Normal rate.   Pulmonary/Chest: Effort normal.  Genitourinary:  Genitourinary Comments: Patient has a rectal teal distal to anal opening. Erythematous. Tender. Negative for exudate.    Neurological: He is alert and oriented to person, place, and time.  Skin: Skin is warm and dry.  Psychiatric: His behavior is normal. Judgment and thought content normal.  Anxious    Depression screen [facility] 2/9 05/29/2016 12/24/2015  Decreased Interest 1 0  Down, Depressed, Hopeless 1 0  PHQ - 2 Score 2 0  Altered sleeping 2 -  Tired, decreased energy 2 -  Change in appetite 1 -  Feeling bad or failure about yourself  1 -  Trouble concentrating 3 -  Moving slowly or fidgety/restless 3 -  Suicidal thoughts 0 -  PHQ-9 Score 14 -  Difficult doing work/chores Very difficult -   Vitals:   05/29/16 1203  BP: 114/60  Pulse: 61  Resp: 18  Temp: 98.3 F (36.8 C)    Assessment & Plan:  1. Encounter to discuss test  results Anticipatory guidance and health promotion counseling provided.  2. Influenza vaccination administered at current visit - Flu Vaccine QUAD 36+ mos IM  3. Anal fissure, small rectal tear present. Plan: Lidocaine 5%, rectal gel -apply 5 minutes prior to bowel movement and up to 3 times daily as needed. Diltiazem 2% rectal gel-apply pea sized amount to anal fissure up to 3 times daily.  4. General Anxiety Disorder with Depression symptoms, stable Plan: Continue escitalopram 10 mg daily. Return for care if symptoms worsen or do not improve.  Follow-up as needed.  Carroll Sage. Kenton Kingfisher, MSN, FNP-C [facility]

## 2016-06-06 ENCOUNTER — Ambulatory Visit (INDEPENDENT_AMBULATORY_CARE_PROVIDER_SITE_OTHER): Payer: PPO

## 2016-06-06 DIAGNOSIS — J309 Allergic rhinitis, unspecified: Secondary | ICD-10-CM

## 2016-06-12 ENCOUNTER — Ambulatory Visit: Payer: PPO

## 2016-06-13 ENCOUNTER — Ambulatory Visit (INDEPENDENT_AMBULATORY_CARE_PROVIDER_SITE_OTHER): Payer: PPO

## 2016-06-13 DIAGNOSIS — L501 Idiopathic urticaria: Secondary | ICD-10-CM | POA: Diagnosis not present

## 2016-06-13 DIAGNOSIS — L508 Other urticaria: Secondary | ICD-10-CM

## 2016-06-15 ENCOUNTER — Ambulatory Visit (INDEPENDENT_AMBULATORY_CARE_PROVIDER_SITE_OTHER): Payer: PPO | Admitting: *Deleted

## 2016-06-15 DIAGNOSIS — J309 Allergic rhinitis, unspecified: Secondary | ICD-10-CM

## 2016-06-15 NOTE — Progress Notes (Signed)
.     Immunotherapy Allergen    Row Name 05/23/16 1041 05/25/16 1400 05/31/16 0900 06/02/16 1000 06/06/16 1500   Signed Consent Yes Yes Yes Yes Yes   Reaction with last injection No No No No No   Pregnant N/A N/A N/A N/A N/A   Patient Taking Beta-Blocker/Ace Inhibitor No No No No No   Are you ill or fever today No No No No No   Asthma exacerbation or symptoms No No No No No   Epi-Pen Yes Yes Yes Yes Yes   Allergen GRASS-TREE GRASS-TREE GRASS-TREE GRASS-TREE grass-tree   Expiration Date  05/04/17 05/04/17 05/04/17 05/04/17 05/04/17   Vial Concentration  1:100,000 (Blue) 1:100,000 (Blue) 1:100,000 (Blue) 1:100,000 (Blue) 1:100,000 (Blue)   Schedule  B B B B B   Dose (mL)  0.05 0.1 0.2 0.3 0.4   Location  RMA LMA RUA LMA RMA   Reaction  0 0 0 0 0   Given by   [place] clw clw   Additional Allergens Yes Yes Yes Yes Yes   Allergen DMITE-WEED DMITE-WEED DMITE-WEED DMITE-WEED DMITE-WEED   Expiration Date  05/04/17 05/04/17 05/04/17 05/04/17 05/04/17   Vial Concentration  1:100,000 (Blue) 1:100,000 (Blue) 1:100,000 (Blue) 1:100,000 (Blue) 1:100,000 (Blue)   Schedule  B B B B B   Dose (mL)  0.05 0.1 0.2 0.3 0.4   Location  LMA RMA LUA RMA LMA   Reaction   -  - 0  -  -   Given by   [place] clw clw   Additional Allergens No No No No No   Row Name 06/15/16 0900           Signed Consent Yes       Reaction with last injection No       Pregnant N/A       Patient Taking Beta-Blocker/Ace Inhibitor No       Are you ill or fever today No       Asthma exacerbation or symptoms No       Epi-Pen Yes       Allergen grass-tree       Expiration Date  05/04/17       Vial Concentration  1:100,000 (Blue)       Schedule  B       Dose (mL)  0.5       Location  LUA       Reaction  0       Given by   [email or web address]       Additional Allergens Yes       Allergen DMITE-WEED       Expiration Date  05/04/17       Vial Concentration  1:100,000 (Blue)       Schedule  B       Dose (mL)  0.5        Location  RUA       Reaction  0       Given by   [email or web address]       Additional Allergens No            Immunotherapy Xolair    Row Name 05/15/16 1053 06/13/16 1500         Signed Consent Yes Yes      Reaction with last injection No No      Pregnant N/A N/A      Patient Taking Beta-Blocker/Ace Inhibitor No No  Are you ill or fever today No No      Asthma exacerbation or symptoms No No      Epi-Pen Yes Yes      Lot # [id number]      Expiration Date 08/18/19 12/18/19      Schedule 4 weeks 4 weeks      Next appointment date 06/12/16 07/11/16      Dosage to be given 300 mg 300 mg      Volume 1.2 ml 1.2 ml      Location RUA LMA      Reaction none 0      Delayed Reaction none  -      Given by pes,cma clw      Volume 1.2 ml 1.2 ml      Location LUA RMA      Reaction none  -      Delayed Reaction none  -      Given by pes,cma clw

## 2016-06-21 ENCOUNTER — Telehealth: Payer: Self-pay

## 2016-06-21 ENCOUNTER — Ambulatory Visit (INDEPENDENT_AMBULATORY_CARE_PROVIDER_SITE_OTHER): Payer: PPO

## 2016-06-21 DIAGNOSIS — J309 Allergic rhinitis, unspecified: Secondary | ICD-10-CM

## 2016-06-21 NOTE — Telephone Encounter (Signed)
Pt is having a lot of nasal congestion that is causing complications with sleeping. He states he is snoring because of it. He is doing all of his meds but he needs relief is their anything else you can recommend?

## 2016-06-21 NOTE — Telephone Encounter (Signed)
Please have patient come to clinic for an injection of Depo-Medrol 80 mg IM.

## 2016-06-23 ENCOUNTER — Ambulatory Visit (INDEPENDENT_AMBULATORY_CARE_PROVIDER_SITE_OTHER): Payer: PPO | Admitting: *Deleted

## 2016-06-23 DIAGNOSIS — J309 Allergic rhinitis, unspecified: Secondary | ICD-10-CM | POA: Diagnosis not present

## 2016-06-23 DIAGNOSIS — J019 Acute sinusitis, unspecified: Secondary | ICD-10-CM

## 2016-06-23 MED ORDER — METHYLPREDNISOLONE ACETATE 80 MG/ML IJ SUSP
80.0000 mg | Freq: Once | INTRAMUSCULAR | Status: AC
  Administered 2016-06-23: 80 mg via INTRAMUSCULAR

## 2016-06-23 NOTE — Progress Notes (Signed)
Immunotherapy   Patient Details  Name: Jeremy Villa MRN: [id number] Date of Birth: 1991/04/05  06/23/2016  [name] here to pick up  Gold vial 1:10,000 < Grass-tree & [place]> Following schedule: B  Frequency:2 times per week Epi-Pen:Epi-Pen Available  Consent signed and patient instructions given. Patient taking vials to [facility] to receive injections.    Constance Holster 06/23/2016, 4:18 PM

## 2016-06-23 NOTE — Telephone Encounter (Signed)
Patient inform. Will stop by today for steroid injection.

## 2016-06-23 NOTE — Telephone Encounter (Signed)
Left message for pt to contact office

## 2016-07-18 ENCOUNTER — Ambulatory Visit (INDEPENDENT_AMBULATORY_CARE_PROVIDER_SITE_OTHER): Payer: PPO

## 2016-07-18 DIAGNOSIS — L500 Allergic urticaria: Secondary | ICD-10-CM

## 2016-07-20 ENCOUNTER — Encounter: Payer: Self-pay | Admitting: *Deleted

## 2016-07-20 NOTE — Progress Notes (Signed)
Immunotherapy   Patient Details  Name: Randal Wiesehan MRN: [id number] Date of Birth: 19-Nov-1990  07/20/2016  [name] here to pick up  Green vials 1:1000 (Grass-Tree/[place]) Following schedule: B  Frequency:1-2 times per week. Epi-Pen:Epi-Pen Available  Consent signed and patient instructions given. Patient did not receive an injection today as he just completed his Gold vial at his other allergist who is giving him the injections. No problems with previous vial.    Constance Holster 07/20/2016, 5:59 PM

## 2016-08-15 ENCOUNTER — Ambulatory Visit (INDEPENDENT_AMBULATORY_CARE_PROVIDER_SITE_OTHER): Payer: PPO | Admitting: *Deleted

## 2016-08-15 DIAGNOSIS — L500 Allergic urticaria: Secondary | ICD-10-CM

## 2016-08-16 DIAGNOSIS — L500 Allergic urticaria: Secondary | ICD-10-CM

## 2016-09-07 ENCOUNTER — Ambulatory Visit (INDEPENDENT_AMBULATORY_CARE_PROVIDER_SITE_OTHER): Payer: PPO | Admitting: *Deleted

## 2016-09-07 DIAGNOSIS — L500 Allergic urticaria: Secondary | ICD-10-CM | POA: Diagnosis not present

## 2016-09-07 NOTE — Progress Notes (Signed)
Immunotherapy   Patient Details  Name: Jeremy Villa MRN: [id number] Date of Birth: Sep 25, 1990  09/07/2016  [name] here to pick up  Red vial #1 1:100 <Grass-Tree & M[place]-Weed> Following schedule: B  Frequency:1 time per week continue at 0.50 until vial empty.  Epi-Pen:Epi-Pen Available  Consent signed and patient instructions given. No problems after 30 minutes in office.    Constance Holster 09/07/2016, 6:53 PM

## 2016-09-14 ENCOUNTER — Ambulatory Visit (INDEPENDENT_AMBULATORY_CARE_PROVIDER_SITE_OTHER): Payer: PPO | Admitting: *Deleted

## 2016-09-14 ENCOUNTER — Ambulatory Visit: Payer: PPO

## 2016-09-14 DIAGNOSIS — L500 Allergic urticaria: Secondary | ICD-10-CM | POA: Diagnosis not present

## 2016-09-27 NOTE — Addendum Note (Signed)
Addended by: Felipa Emory on: 09/27/2016 04:15 PM   Modules accepted: Orders

## 2016-10-12 ENCOUNTER — Ambulatory Visit (INDEPENDENT_AMBULATORY_CARE_PROVIDER_SITE_OTHER): Payer: PPO

## 2016-10-12 DIAGNOSIS — L500 Allergic urticaria: Secondary | ICD-10-CM

## 2016-10-25 ENCOUNTER — Telehealth: Payer: Self-pay | Admitting: Allergy and Immunology

## 2016-10-25 NOTE — Telephone Encounter (Signed)
We are working on Dr [employer] - contested dates in question

## 2016-10-25 NOTE — Telephone Encounter (Signed)
Pt received a bill for his injections with DOS from September 2017. I corrected his [place] in the computer to get a valid verification back and now he just needs these claims to be re-filed please. Can you call pt back after re-filing just to follow up with him on this? Thanks!

## 2016-11-09 ENCOUNTER — Ambulatory Visit: Payer: PPO

## 2016-11-10 ENCOUNTER — Ambulatory Visit (INDEPENDENT_AMBULATORY_CARE_PROVIDER_SITE_OTHER): Payer: PPO

## 2016-11-10 DIAGNOSIS — L500 Allergic urticaria: Secondary | ICD-10-CM | POA: Diagnosis not present

## 2016-12-07 DIAGNOSIS — J301 Allergic rhinitis due to pollen: Secondary | ICD-10-CM | POA: Diagnosis not present

## 2016-12-08 ENCOUNTER — Ambulatory Visit: Payer: Self-pay

## 2016-12-08 DIAGNOSIS — J3089 Other allergic rhinitis: Secondary | ICD-10-CM | POA: Diagnosis not present

## 2016-12-13 ENCOUNTER — Ambulatory Visit: Payer: Self-pay

## 2016-12-29 ENCOUNTER — Ambulatory Visit (INDEPENDENT_AMBULATORY_CARE_PROVIDER_SITE_OTHER): Payer: PPO | Admitting: *Deleted

## 2016-12-29 DIAGNOSIS — L500 Allergic urticaria: Secondary | ICD-10-CM

## 2017-06-19 ENCOUNTER — Ambulatory Visit (INDEPENDENT_AMBULATORY_CARE_PROVIDER_SITE_OTHER): Payer: PPO | Admitting: Physician Assistant

## 2017-06-19 ENCOUNTER — Encounter: Payer: Self-pay | Admitting: Physician Assistant

## 2017-06-19 ENCOUNTER — Encounter: Payer: PPO | Admitting: Physician Assistant

## 2017-06-19 VITALS — BP 116/72 | HR 70 | Temp 98.3°F | Resp 16 | Ht 66.0 in | Wt 150.0 lb

## 2017-06-19 DIAGNOSIS — Z23 Encounter for immunization: Secondary | ICD-10-CM

## 2017-06-19 DIAGNOSIS — N529 Male erectile dysfunction, unspecified: Secondary | ICD-10-CM

## 2017-06-19 DIAGNOSIS — Z1329 Encounter for screening for other suspected endocrine disorder: Secondary | ICD-10-CM

## 2017-06-19 DIAGNOSIS — Z113 Encounter for screening for infections with a predominantly sexual mode of transmission: Secondary | ICD-10-CM

## 2017-06-19 DIAGNOSIS — Z130 Encounter for screening for diseases of the blood and blood-forming organs and certain disorders involving the immune mechanism: Secondary | ICD-10-CM | POA: Diagnosis not present

## 2017-06-19 DIAGNOSIS — Z13228 Encounter for screening for other metabolic disorders: Secondary | ICD-10-CM

## 2017-06-19 DIAGNOSIS — Z131 Encounter for screening for diabetes mellitus: Secondary | ICD-10-CM | POA: Diagnosis not present

## 2017-06-19 DIAGNOSIS — Z0000 Encounter for general adult medical examination without abnormal findings: Secondary | ICD-10-CM | POA: Diagnosis not present

## 2017-06-19 DIAGNOSIS — Z13220 Encounter for screening for lipoid disorders: Secondary | ICD-10-CM

## 2017-06-19 MED ORDER — DILTIAZEM GEL 2 %: CUTANEOUS | 0 refills | Status: DC

## 2017-06-19 NOTE — Patient Instructions (Addendum)

## 2017-06-19 NOTE — Progress Notes (Signed)
PRIMARY CARE AT [facility] [address], [place] [address] [phone]  Date:  06/19/2017   Name:  Jeremy Villa   DOB:  October 14, 1990   MRN:  [id number]  PCP:  Patient, No Pcp Per    History of Present Illness:  Jeremy Villa is a 26 y.o. male patient who presents to PCP with  Chief Complaint  Patient presents with  . Annual Exam     DIET: none restricted.  No restrictions.  Vegetables are not daily.  64 oz of water per day.  1-2 cups of black coffee, which he drinks at any hour.  Bm: normal.  At times straining and burning.  No diarrhea.  No black or bloody stool.  URINATION: mild frequency.  No hematuria, but has frequency.  No weakened stream.  No dysuria.  SLEEP: 4-6 hours.  Difficulty with staying asleep.    SOCIAL ACTIVITY:  2 weeks ago, he was married.  States that he can not keep an erection for more than a few minutes at times.  He wakes with an erection.  Sexually aroused with wife.  Acknowledges that there is some anxiety with performance.  He is requesting labs today.  He also states that the amount of ejaculation is less lately.  They are planning for a family as well.  He is anxious about this.  2 weeks sexually active.   EtOH: none Illicit drug use: none Tobacco or vaping: none  Exercise: once per week or every other week.    Patient Active Problem List   Diagnosis Date Noted  . OCD (obsessive compulsive disorder) 04/17/2016  . Hypogonadism in male 12/24/2015  . Generalized anxiety disorder 12/24/2015  . Erectile dysfunction 12/24/2015  . Allergic urticaria 09/01/2015  . Allergic rhinoconjunctivitis 09/01/2015  . Dry eye syndrome 09/01/2015    Past Medical History:  Diagnosis Date  . Allergic rhinoconjunctivitis   . Allergy   . Anxiety   . Depression   . Neuromuscular disorder ([place])   . Urticaria     Past Surgical History:  Procedure Laterality Date  . EYE SURGERY    . LASIK      Social History  Substance Use Topics  . Smoking status:  Former Smoker    Quit date: 08/25/2014  . Smokeless tobacco: Never Used  . Alcohol use No    Family History  Problem Relation Age of Onset  . Diabetes Mother   . High blood pressure Father     Allergies  Allergen Reactions  . Tree Extract Itching  . Wheat Bran Itching    Medication list has been reviewed and updated.  Current Outpatient Prescriptions on File Prior to Visit  Medication Sig Dispense Refill  . Ascorbic Acid (VITAMIN C PO) Take by mouth daily. Reported on 12/24/2015    . B Complex Vitamins (VITAMIN B COMPLEX PO) Take by mouth daily.    . budesonide (RHINOCORT ALLERGY) 32 MCG/ACT nasal spray Place 1 spray into both nostrils daily.    . cycloSPORINE (RESTASIS) 0.05 % ophthalmic emulsion 1 drop.    Marland Kitchen diltiazem 2 % GEL Apply a pea-sized amount to the affected area 3 times daily. (Patient not taking: Reported on 06/19/2017) 30 g 0  . EPINEPHrine (EPIPEN 2-PAK) 0.3 mg/0.3 mL IJ SOAJ injection Inject into the muscle. Reported on 12/24/2015    . escitalopram (LEXAPRO) 5 MG tablet Take 1 tablet (5 mg total) by mouth daily. (Patient not taking: Reported on 06/19/2017) 30 tablet 1  . eszopiclone (LUNESTA) 2  MG TABS tablet TK 1 T PO QHS FOR SLEEP  0  . fexofenadine (ALLEGRA) 180 MG tablet Take 180 mg by mouth. Reported on 09/28/2015    . FLUVIRIN SUSP Reported on 12/24/2015  0  . ketotifen (ZADITOR) 0.025 % ophthalmic solution 1 drop. Reported on 09/28/2015    . Lidocaine, Anorectal, 5 % GEL Apply a pea-sized amount to the affected area 3 times daily. (Patient not taking: Reported on 06/19/2017) 30 g 0  . methocarbamol (ROBAXIN) 500 MG tablet     . methocarbamol (ROBAXIN) 750 MG tablet TK 1 T PO Q 6 H PRF SPASMS  0  . naproxen (NAPROSYN) 500 MG tablet Take 1 tablet (500 mg total) by mouth 2 (two) times daily. (Patient not taking: Reported on 06/19/2017) 20 tablet 0  . Olopatadine HCl (PAZEO) 0.7 % SOLN Apply 1 drop to eye daily as needed (for itchy eyes.). (Patient not taking: Reported on  06/19/2017) 2.5 mL 5  . Olopatadine HCl 0.6 % SOLN Place into the nose at bedtime. Reported on 12/24/2015    . Polyvinyl Alcohol-Povidone (REFRESH OP) Apply to eye as needed. Reported on 12/24/2015    . POTASSIUM PO Take by mouth daily. Reported on 12/24/2015    . sildenafil (REVATIO) 20 MG tablet 1-2 tablets one hour prior to intercourse. Do not take nitroglycerin with this product. (Patient not taking: Reported on 06/19/2017) 25 tablet 3  . VITAMIN A PO Take by mouth daily.    Marland Kitchen VITAMIN E PO Take by mouth daily.     Current Facility-Administered Medications on File Prior to Visit  Medication Dose Route Frequency Provider Last Rate Last Dose  . omalizumab Arvid Right) injection 300 mg  300 mg Subcutaneous Q28 days Kozlow, Donnamarie Poag, MD   300 mg at 12/29/16 1626    ROS ROS otherwise unremarkable unless listed above.  Physical Examination: BP 116/72   Pulse 70   Temp 98.3 F (36.8 C) (Oral)   Resp 16   SpO2 99%  Ideal Body Weight:    Physical Exam  Constitutional: He is oriented to person, place, and time. He appears well-developed and well-nourished. No distress.  HENT:  Head: Normocephalic and atraumatic.  Right Ear: Tympanic membrane, external ear and ear canal normal.  Left Ear: Tympanic membrane, external ear and ear canal normal.  Eyes: Pupils are equal, round, and reactive to light. Conjunctivae and EOM are normal.  Cardiovascular: Normal rate, regular rhythm, normal heart sounds and intact distal pulses.  Exam reveals no friction rub.   No murmur heard. Pulmonary/Chest: Effort normal. No respiratory distress. He has no wheezes.  Abdominal: Soft. Bowel sounds are normal. He exhibits no distension and no mass. There is no tenderness.  Musculoskeletal: Normal range of motion. He exhibits no edema or tenderness.  Neurological: He is alert and oriented to person, place, and time. He displays normal reflexes.  Skin: Skin is warm and dry. He is not diaphoretic.  Psychiatric: He has a normal  mood and affect. His behavior is normal.     Assessment and Plan: Jeremy Villa is a 26 y.o. male who is here today for annual physical exam. Return for testosterone tomorrow morning.   Annual physical exam - Plan: CBC, CMP14+EGFR, HIV antibody, TSH, RPR, GC/Chlamydia Probe Amp, Lipid panel, Testosterone, Hemoglobin A1c, CANCELED: Testosterone, CANCELED: Flu Vaccine QUAD 36+ mos IM  Screening for deficiency anemia - Plan: CBC  Screening for STD (sexually transmitted disease) - Plan: HIV antibody, GC/Chlamydia Probe Amp  Screening for  thyroid disorder - Plan: TSH  Screening for lipid disorders - Plan: Lipid panel  Erectile dysfunction, unspecified erectile dysfunction type - Plan: Testosterone, CANCELED: Testosterone  Screening for metabolic disorder - Plan: CMP14+EGFR  Screening for diabetes mellitus - Plan: Hemoglobin A1c  Need for prophylactic vaccination and inoculation against influenza - Plan: Flu Vaccine QUAD 36+ mos IM, CANCELED: Flu Vaccine QUAD 36+ mos IM  Ivar Drape, PA-C [facility] 10/2/20181:13 PM

## 2017-06-20 ENCOUNTER — Ambulatory Visit: Payer: PPO | Admitting: Physician Assistant

## 2017-06-20 DIAGNOSIS — N529 Male erectile dysfunction, unspecified: Secondary | ICD-10-CM

## 2017-06-20 DIAGNOSIS — Z0000 Encounter for general adult medical examination without abnormal findings: Secondary | ICD-10-CM

## 2017-06-20 LAB — HIV ANTIBODY (ROUTINE TESTING W REFLEX): HIV Screen 4th Generation wRfx: NONREACTIVE

## 2017-06-20 LAB — CBC
HEMATOCRIT: 41.5 % (ref 37.5–51.0)
HEMOGLOBIN: 13.7 g/dL (ref 13.0–17.7)
MCH: 27.2 pg (ref 26.6–33.0)
MCHC: 33 g/dL (ref 31.5–35.7)
MCV: 83 fL (ref 79–97)
Platelets: 402 10*3/uL — ABNORMAL HIGH (ref 150–379)
RBC: 5.03 x10E6/uL (ref 4.14–5.80)
RDW: 14.3 % (ref 12.3–15.4)
WBC: 6.7 10*3/uL (ref 3.4–10.8)

## 2017-06-20 LAB — LIPID PANEL
CHOLESTEROL TOTAL: 147 mg/dL (ref 100–199)
Chol/HDL Ratio: 3.9 ratio (ref 0.0–5.0)
HDL: 38 mg/dL — AB (ref >39)
LDL Calculated: 90 mg/dL (ref 0–99)
Triglycerides: 93 mg/dL (ref 0–149)
VLDL CHOLESTEROL CAL: 19 mg/dL (ref 5–40)

## 2017-06-20 LAB — CMP14+EGFR
ALBUMIN: 4.8 g/dL (ref 3.5–5.5)
ALT: 28 IU/L (ref 0–44)
AST: 17 IU/L (ref 0–40)
Albumin/Globulin Ratio: 1.8 (ref 1.2–2.2)
Alkaline Phosphatase: 59 IU/L (ref 39–117)
BUN / CREAT RATIO: 13 (ref 9–20)
BUN: 11 mg/dL (ref 6–20)
Bilirubin Total: 0.3 mg/dL (ref 0.0–1.2)
CALCIUM: 9.9 mg/dL (ref 8.7–10.2)
CO2: 22 mmol/L (ref 20–29)
CREATININE: 0.87 mg/dL (ref 0.76–1.27)
Chloride: 101 mmol/L (ref 96–106)
GFR, EST AFRICAN AMERICAN: 136 mL/min/{1.73_m2} (ref >59)
GFR, EST NON AFRICAN AMERICAN: 117 mL/min/{1.73_m2} (ref >59)
GLOBULIN, TOTAL: 2.6 g/dL (ref 1.5–4.5)
Glucose: 105 mg/dL — ABNORMAL HIGH (ref 65–99)
Potassium: 4.6 mmol/L (ref 3.5–5.2)
SODIUM: 138 mmol/L (ref 134–144)
Total Protein: 7.4 g/dL (ref 6.0–8.5)

## 2017-06-20 LAB — HEMOGLOBIN A1C
ESTIMATED AVERAGE GLUCOSE: 123 mg/dL
HEMOGLOBIN A1C: 5.9 % — AB (ref 4.8–5.6)

## 2017-06-20 LAB — GC/CHLAMYDIA PROBE AMP
Chlamydia trachomatis, NAA: NEGATIVE
NEISSERIA GONORRHOEAE BY PCR: NEGATIVE

## 2017-06-20 LAB — RPR: RPR Ser Ql: NONREACTIVE

## 2017-06-20 LAB — TSH: TSH: 4.02 u[IU]/mL (ref 0.450–4.500)

## 2017-06-21 LAB — TESTOSTERONE: Testosterone: 347 ng/dL (ref 264–916)

## 2017-06-26 ENCOUNTER — Encounter: Payer: Self-pay | Admitting: Physician Assistant

## 2017-06-27 ENCOUNTER — Other Ambulatory Visit: Payer: Self-pay | Admitting: Physician Assistant

## 2017-06-27 DIAGNOSIS — N529 Male erectile dysfunction, unspecified: Secondary | ICD-10-CM

## 2017-06-30 ENCOUNTER — Ambulatory Visit (INDEPENDENT_AMBULATORY_CARE_PROVIDER_SITE_OTHER): Payer: PPO | Admitting: Family Medicine

## 2017-06-30 ENCOUNTER — Encounter: Payer: Self-pay | Admitting: Family Medicine

## 2017-06-30 VITALS — BP 116/78 | HR 79 | Temp 98.0°F | Resp 16 | Ht 65.75 in | Wt 147.0 lb

## 2017-06-30 DIAGNOSIS — R12 Heartburn: Secondary | ICD-10-CM

## 2017-06-30 DIAGNOSIS — R7303 Prediabetes: Secondary | ICD-10-CM

## 2017-06-30 DIAGNOSIS — K30 Functional dyspepsia: Secondary | ICD-10-CM | POA: Diagnosis not present

## 2017-06-30 NOTE — Patient Instructions (Addendum)
See foods to avoid for heartburn. Over the counter zantac for now is ok, OR omeprazole over the counter up to once per day. If you require those meds greater than 6 weeks, I would recommend meeting with gastroenterologist.  For prediabetes, see diet information below, and continue exercise most days of the weeks.  I would recommend checking blood sugar no more than once per week for now.  Recheck blood sugar in 3 months.    Prediabetes Eating Plan Prediabetes-also called impaired glucose tolerance or impaired fasting glucose-is a condition that causes blood sugar (blood glucose) levels to be higher than normal. Following a healthy diet can help to keep prediabetes under control. It can also help to lower the risk of type 2 diabetes and heart disease, which are increased in people who have prediabetes. Along with regular exercise, a healthy diet:  Promotes weight loss.  Helps to control blood sugar levels.  Helps to improve the way that the body uses insulin.  What do I need to know about this eating plan?  Use the glycemic index (GI) to plan your meals. The index tells you how quickly a food will raise your blood sugar. Choose low-GI foods. These foods take a longer time to raise blood sugar.  Pay close attention to the amount of carbohydrates in the food that you eat. Carbohydrates increase blood sugar levels.  Keep track of how many calories you take in. Eating the right amount of calories will help you to achieve a healthy weight. Losing about 7 percent of your starting weight can help to prevent type 2 diabetes.  You may want to follow a Mediterranean diet. This diet includes a lot of vegetables, lean meats or fish, whole grains, fruits, and healthy oils and fats. What foods can I eat? Grains Whole grains, such as whole-wheat or whole-grain breads, crackers, cereals, and pasta. Unsweetened oatmeal. Bulgur. Barley. Quinoa. Brown rice. Corn or whole-wheat flour tortillas or taco  shells. Vegetables Lettuce. Spinach. Peas. Beets. Cauliflower. Cabbage. Broccoli. Carrots. Tomatoes. Squash. Eggplant. Herbs. Peppers. Onions. Cucumbers. Brussels sprouts. Fruits Berries. Bananas. Apples. Oranges. Grapes. Papaya. Mango. Pomegranate. Kiwi. Grapefruit. Cherries. Meats and Other Protein Sources Seafood. Lean meats, such as chicken and [place] or lean cuts of pork and beef. Tofu. Eggs. Nuts. Beans. Dairy Low-fat or fat-free dairy products, such as yogurt, cottage cheese, and cheese. Beverages Water. Tea. Coffee. Sugar-free or diet soda. Seltzer water. Milk. Milk alternatives, such as soy or almond milk. Condiments Mustard. Relish. Low-fat, low-sugar ketchup. Low-fat, low-sugar barbecue sauce. Low-fat or fat-free mayonnaise. Sweets and Desserts Sugar-free or low-fat pudding. Sugar-free or low-fat ice cream and other frozen treats. Fats and Oils Avocado. Walnuts. Olive oil. The items listed above may not be a complete list of recommended foods or beverages. Contact your dietitian for more options. What foods are not recommended? Grains Refined white flour and flour products, such as bread, pasta, snack foods, and cereals. Beverages Sweetened drinks, such as sweet iced tea and soda. Sweets and Desserts Baked goods, such as cake, cupcakes, pastries, cookies, and cheesecake. The items listed above may not be a complete list of foods and beverages to avoid. Contact your dietitian for more information. This information is not intended to replace advice given to you by your health care provider. Make sure you discuss any questions you have with your health care provider. Document Released: 01/19/2015 Document Revised: 02/10/2016 Document Reviewed: 09/30/2014 Elsevier Interactive Patient Education  2017 [place] for Gastroesophageal Reflux Disease,  Adult When you have gastroesophageal reflux disease (GERD), the foods you eat and your eating habits are very  important. Choosing the right foods can help ease the discomfort of GERD. Consider working with a diet and nutrition specialist (dietitian) to help you make healthy food choices. What general guidelines should I follow? Eating plan  Choose healthy foods low in fat, such as fruits, vegetables, whole grains, low-fat dairy products, and lean meat, fish, and poultry.  Eat frequent, small meals instead of three large meals each day. Eat your meals slowly, in a relaxed setting. Avoid bending over or lying down until 2-3 hours after eating.  Limit high-fat foods such as fatty meats or fried foods.  Limit your intake of oils, butter, and shortening to less than 8 teaspoons each day.  Avoid the following: ? Foods that cause symptoms. These may be different for different people. Keep a food diary to keep track of foods that cause symptoms. ? Alcohol. ? Drinking large amounts of liquid with meals. ? Eating meals during the 2-3 hours before bed.  Cook foods using methods other than frying. This may include baking, grilling, or broiling. Lifestyle   Maintain a healthy weight. Ask your health care provider what weight is healthy for you. If you need to lose weight, work with your health care provider to do so safely.  Exercise for at least 30 minutes on 5 or more days each week, or as told by your health care provider.  Avoid wearing clothes that fit tightly around your waist and chest.  Do not use any products that contain nicotine or tobacco, such as cigarettes and e-cigarettes. If you need help quitting, ask your health care provider.  Sleep with the head of your bed raised. Use a wedge under the mattress or blocks under the bed frame to raise the head of the bed. What foods are not recommended? The items listed may not be a complete list. Talk with your dietitian about what dietary choices are best for you. Grains Pastries or quick breads with added fat. [place] toast. Vegetables Deep fried  vegetables. [place] fries. Any vegetables prepared with added fat. Any vegetables that cause symptoms. For some people this may include tomatoes and tomato products, chili peppers, onions and garlic, and horseradish. Fruits Any fruits prepared with added fat. Any fruits that cause symptoms. For some people this may include citrus fruits, such as oranges, grapefruit, pineapple, and lemons. Meats and other protein foods High-fat meats, such as fatty beef or pork, hot dogs, ribs, ham, sausage, salami and bacon. Fried meat or protein, including fried fish and fried chicken. Nuts and nut butters. Dairy Whole milk and chocolate milk. Sour cream. Cream. Ice cream. Cream cheese. Milk shakes. Beverages Coffee and tea, with or without caffeine. Carbonated beverages. Sodas. Energy drinks. Fruit juice made with acidic fruits (such as orange or grapefruit). Tomato juice. Alcoholic drinks. Fats and oils Butter. Margarine. Shortening. Ghee. Sweets and desserts Chocolate and cocoa. Donuts. Seasoning and other foods Pepper. Peppermint and spearmint. Any condiments, herbs, or seasonings that cause symptoms. For some people, this may include curry, hot sauce, or vinegar-based salad dressings. Summary  When you have gastroesophageal reflux disease (GERD), food and lifestyle choices are very important to help ease the discomfort of GERD.  Eat frequent, small meals instead of three large meals each day. Eat your meals slowly, in a relaxed setting. Avoid bending over or lying down until 2-3 hours after eating.  Limit high-fat foods such as fatty meat  or fried foods. This information is not intended to replace advice given to you by your health care provider. Make sure you discuss any questions you have with your health care provider. Document Released: 09/04/2005 Document Revised: 09/05/2016 Document Reviewed: 09/05/2016 Elsevier Interactive Patient Education  2017 [employer].    IF you received an x-ray  today, you will receive an invoice from [facility]. Please contact [facility] at [phone] with questions or concerns regarding your invoice.   IF you received labwork today, you will receive an invoice from [facility]. Please contact [facility] at [phone] with questions or concerns regarding your invoice.   Our billing staff will not be able to assist you with questions regarding bills from these companies.  You will be contacted with the lab results as soon as they are available. The fastest way to get your results is to activate your My Chart account. Instructions are located on the last page of this paperwork. If you have not heard from [place] regarding the results in 2 weeks, please contact this office.

## 2017-06-30 NOTE — Progress Notes (Signed)
Subjective:  By signing my name below, I, Jeremy Villa, attest that this documentation has been prepared under the direction and in the presence of Jeremy Agreste, MD Electronically Signed: Ladene Artist, ED Scribe 06/30/2017 at 3:14 PM.   Patient ID: Jeremy Villa, male    DOB: Jan 16, 1991, 26 y.o.   MRN: [id number]  Chief Complaint  Patient presents with  . Gastroesophageal Reflux    pt states he was told to come back and have an H-pylori done in the office    HPI Jeremy Villa is a 26 y.o. male who presents to Primary Care at [facility] for follow-up. Last seen on 10/2 by Ivar Drape, PA-C for CPE. E-mail on 10/9 reviewed. Referred to urologist. On review on lab work from 10/2, A1C elevated at 5.9, recommended diet and exercise.   Heartburn Pt states that he has been having intermittent heartburn for 5-6 years which is exacerbated with caffeine and fried foods. He also reports diarrhea which he has often. Denies fever, unexplained weight loss, night sweats, blood in stools. Pt used to take Omeprazole but has stopped this medication. No h/o peptic ulcers. He would like to be tested for H-pylori due to past hx 5 years ago and states his wife currently has H-pylori. Pt is a nonsmoker.   Pre-Diabetes Pt reports that he is very concerned about his prediabetes diagnosis. Pt purchased a glucometer and checks his blood glucose daily with fasting morning averages of 109-110. He started exercising 2 weeks ago.   Patient Active Problem List   Diagnosis Date Noted  . OCD (obsessive compulsive disorder) 04/17/2016  . Hypogonadism in male 12/24/2015  . Generalized anxiety disorder 12/24/2015  . Erectile dysfunction 12/24/2015  . Allergic urticaria 09/01/2015  . Allergic rhinoconjunctivitis 09/01/2015  . Dry eye syndrome 09/01/2015   Past Medical History:  Diagnosis Date  . Allergic rhinoconjunctivitis   . Allergy   . Anxiety   . Depression   . Neuromuscular disorder ([place])   .  Urticaria    Past Surgical History:  Procedure Laterality Date  . EYE SURGERY    . LASIK     Allergies  Allergen Reactions  . Tree Extract Itching  . Wheat Bran Itching   Prior to Admission medications   Medication Sig Start Date End Date Taking? Authorizing Provider  Ascorbic Acid (VITAMIN C PO) Take by mouth daily. Reported on 12/24/2015    [provider]  B Complex Vitamins (VITAMIN B COMPLEX PO) Take by mouth daily.    [provider]  budesonide (RHINOCORT ALLERGY) 32 MCG/ACT nasal spray Place 1 spray into both nostrils daily.    [provider]  cycloSPORINE (RESTASIS) 0.05 % ophthalmic emulsion 1 drop.    [provider]  diltiazem 2 % GEL Apply a pea-sized amount to the affected area 3 times daily. 06/19/17   Ivar Drape D, PA  EPINEPHrine (EPIPEN 2-PAK) 0.3 mg/0.3 mL IJ SOAJ injection Inject into the muscle. Reported on 12/24/2015    [provider]  escitalopram (LEXAPRO) 5 MG tablet Take 1 tablet (5 mg total) by mouth daily. Patient not taking: Reported on 06/19/2017 04/17/16   Posey Boyer, MD  eszopiclone (LUNESTA) 2 MG TABS tablet TK 1 T PO QHS FOR SLEEP 11/04/15   [provider]  fexofenadine (ALLEGRA) 180 MG tablet Take 180 mg by mouth. Reported on 09/28/2015    [provider]  Payton Mccallum Reported on 12/24/2015 07/30/15   [provider]  ketotifen (ZADITOR) 0.025 %  ophthalmic solution 1 drop. Reported on 09/28/2015    [provider]  Lidocaine, Anorectal, 5 % GEL Apply a pea-sized amount to the affected area 3 times daily. Patient not taking: Reported on 06/19/2017 05/29/16   Scot Jun, FNP  methocarbamol (ROBAXIN) 500 MG tablet  12/20/15   [provider]  methocarbamol (ROBAXIN) 750 MG tablet TK 1 T PO Q 6 H PRF SPASMS 10/22/15   [provider]  naproxen (NAPROSYN) 500 MG tablet Take 1 tablet (500 mg total) by mouth 2 (two) times daily. Patient not taking:  Reported on 06/19/2017 12/20/15   Etta Quill, NP  Olopatadine HCl (PAZEO) 0.7 % SOLN Apply 1 drop to eye daily as needed (for itchy eyes.). Patient not taking: Reported on 06/19/2017 09/28/15   Jiles Prows, MD  Olopatadine HCl 0.6 % SOLN Place into the nose at bedtime. Reported on 12/24/2015    [provider]  Polyvinyl Alcohol-Povidone ([place] OP) Apply to eye as needed. Reported on 12/24/2015    [provider]  POTASSIUM PO Take by mouth daily. Reported on 12/24/2015    [provider]  sildenafil (REVATIO) 20 MG tablet 1-2 tablets one hour prior to intercourse. Do not take nitroglycerin with this product. Patient not taking: Reported on 06/19/2017 12/24/15   Darlyne Russian, MD  VITAMIN A PO Take by mouth daily.    [provider]  VITAMIN E PO Take by mouth daily.    [provider]   Social History   Social History  . Marital status: Married    Spouse name: N/A  . Number of children: N/A  . Years of education: N/A   Occupational History  . Not on file.   Social History Main Topics  . Smoking status: Former Smoker    Quit date: 08/25/2014  . Smokeless tobacco: Never Used  . Alcohol use No  . Drug use: No  . Sexual activity: Not on file   Other Topics Concern  . Not on file   Social History Narrative  . No narrative on file   Review of Systems  Constitutional: Negative for diaphoresis, fever and unexpected weight change.  Gastrointestinal: Positive for diarrhea. Negative for abdominal pain and blood in stool.      Objective:   Physical Exam  Constitutional: He is oriented to person, place, and time. He appears well-developed and well-nourished. No distress.  HENT:  Head: Normocephalic and atraumatic.  Eyes: Conjunctivae and EOM are normal.  Neck: Neck supple. No tracheal deviation present.  Cardiovascular: Normal rate and regular rhythm.   Pulmonary/Chest: Effort normal and breath sounds normal. No respiratory distress.    Abdominal: Soft. Bowel sounds are normal. He exhibits no distension. There is no tenderness. There is no tenderness at McBurney's point and negative Murphy's sign.  Musculoskeletal: Normal range of motion.  Neurological: He is alert and oriented to person, place, and time.  Skin: Skin is warm and dry.  Psychiatric: He has a normal mood and affect. His behavior is normal.  Nursing note and vitals reviewed.  Vitals:   06/30/17 1450  BP: 116/78  Pulse: 79  Resp: 16  Temp: 98 F (36.7 C)  TempSrc: Oral  SpO2: 98%  Weight: 147 lb (66.7 kg)  Height: 5' 5.75" (1.67 m)      Assessment & Plan:   Beatrice Sehgal is a 26 y.o. male Heartburn - Plan: H. pylori breath test Upset stomach - Plan: H. pylori breath test  - Avoidance  measures discussed for heartburn, over-the-counter Zantac or omeprazole if needed for more persistent symptoms. H. pylori was obtained that was negative.   -If persistent need for PPI/treatment greater than 6 weeks, would recommend gastroenterology evaluation. RTC precautions if worse.   Prediabetes  - Recent diagnosis. Diet handout given, exercise as planned. Reasonable expectations of improved glucose readings were discussed as I would not expect improvent this quick. Recheck A1c in 3 months.  No orders of the defined types were placed in this encounter.  Patient Instructions    See foods to avoid for heartburn. Over the counter zantac for now is ok, OR omeprazole over the counter up to once per day. If you require those meds greater than 6 weeks, I would recommend meeting with gastroenterologist.  For prediabetes, see diet information below, and continue exercise most days of the weeks.  I would recommend checking blood sugar no more than once per week for now.  Recheck blood sugar in 3 months.    Prediabetes Eating Plan Prediabetes-also called impaired glucose tolerance or impaired fasting glucose-is a condition that causes blood sugar (blood glucose) levels  to be higher than normal. Following a healthy diet can help to keep prediabetes under control. It can also help to lower the risk of type 2 diabetes and heart disease, which are increased in people who have prediabetes. Along with regular exercise, a healthy diet:  Promotes weight loss.  Helps to control blood sugar levels.  Helps to improve the way that the body uses insulin.  What do I need to know about this eating plan?  Use the glycemic index (GI) to plan your meals. The index tells you how quickly a food will raise your blood sugar. Choose low-GI foods. These foods take a longer time to raise blood sugar.  Pay close attention to the amount of carbohydrates in the food that you eat. Carbohydrates increase blood sugar levels.  Keep track of how many calories you take in. Eating the right amount of calories will help you to achieve a healthy weight. Losing about 7 percent of your starting weight can help to prevent type 2 diabetes.  You may want to follow a Mediterranean diet. This diet includes a lot of vegetables, lean meats or fish, whole grains, fruits, and healthy oils and fats. What foods can I eat? Grains Whole grains, such as whole-wheat or whole-grain breads, crackers, cereals, and pasta. Unsweetened oatmeal. Bulgur. Barley. Quinoa. Brown rice. Corn or whole-wheat flour tortillas or taco shells. Vegetables Lettuce. Spinach. Peas. Beets. Cauliflower. Cabbage. Broccoli. Carrots. Tomatoes. Squash. Eggplant. Herbs. Peppers. Onions. Cucumbers. Brussels sprouts. Fruits Berries. Bananas. Apples. Oranges. Grapes. Papaya. Mango. Pomegranate. Kiwi. Grapefruit. Cherries. Meats and Other Protein Sources Seafood. Lean meats, such as chicken and [place] or lean cuts of pork and beef. Tofu. Eggs. Nuts. Beans. Dairy Low-fat or fat-free dairy products, such as yogurt, cottage cheese, and cheese. Beverages Water. Tea. Coffee. Sugar-free or diet soda. Seltzer water. Milk. Milk alternatives, such  as soy or almond milk. Condiments Mustard. Relish. Low-fat, low-sugar ketchup. Low-fat, low-sugar barbecue sauce. Low-fat or fat-free mayonnaise. Sweets and Desserts Sugar-free or low-fat pudding. Sugar-free or low-fat ice cream and other frozen treats. Fats and Oils Avocado. Walnuts. Olive oil. The items listed above may not be a complete list of recommended foods or beverages. Contact your dietitian for more options. What foods are not recommended? Grains Refined white flour and flour products, such as bread, pasta, snack foods, and cereals. Beverages Sweetened drinks, such as sweet iced  tea and soda. Sweets and Desserts Baked goods, such as cake, cupcakes, pastries, cookies, and cheesecake. The items listed above may not be a complete list of foods and beverages to avoid. Contact your dietitian for more information. This information is not intended to replace advice given to you by your health care provider. Make sure you discuss any questions you have with your health care provider. Document Released: 01/19/2015 Document Revised: 02/10/2016 Document Reviewed: 09/30/2014 Elsevier Interactive Patient Education  2017 [place] for Gastroesophageal Reflux Disease, Adult When you have gastroesophageal reflux disease (GERD), the foods you eat and your eating habits are very important. Choosing the right foods can help ease the discomfort of GERD. Consider working with a diet and nutrition specialist (dietitian) to help you make healthy food choices. What general guidelines should I follow? Eating plan  Choose healthy foods low in fat, such as fruits, vegetables, whole grains, low-fat dairy products, and lean meat, fish, and poultry.  Eat frequent, small meals instead of three large meals each day. Eat your meals slowly, in a relaxed setting. Avoid bending over or lying down until 2-3 hours after eating.  Limit high-fat foods such as fatty meats or fried  foods.  Limit your intake of oils, butter, and shortening to less than 8 teaspoons each day.  Avoid the following: ? Foods that cause symptoms. These may be different for different people. Keep a food diary to keep track of foods that cause symptoms. ? Alcohol. ? Drinking large amounts of liquid with meals. ? Eating meals during the 2-3 hours before bed.  Cook foods using methods other than frying. This may include baking, grilling, or broiling. Lifestyle   Maintain a healthy weight. Ask your health care provider what weight is healthy for you. If you need to lose weight, work with your health care provider to do so safely.  Exercise for at least 30 minutes on 5 or more days each week, or as told by your health care provider.  Avoid wearing clothes that fit tightly around your waist and chest.  Do not use any products that contain nicotine or tobacco, such as cigarettes and e-cigarettes. If you need help quitting, ask your health care provider.  Sleep with the head of your bed raised. Use a wedge under the mattress or blocks under the bed frame to raise the head of the bed. What foods are not recommended? The items listed may not be a complete list. Talk with your dietitian about what dietary choices are best for you. Grains Pastries or quick breads with added fat. [place] toast. Vegetables Deep fried vegetables. [place] fries. Any vegetables prepared with added fat. Any vegetables that cause symptoms. For some people this may include tomatoes and tomato products, chili peppers, onions and garlic, and horseradish. Fruits Any fruits prepared with added fat. Any fruits that cause symptoms. For some people this may include citrus fruits, such as oranges, grapefruit, pineapple, and lemons. Meats and other protein foods High-fat meats, such as fatty beef or pork, hot dogs, ribs, ham, sausage, salami and bacon. Fried meat or protein, including fried fish and fried chicken. Nuts and nut  butters. Dairy Whole milk and chocolate milk. Sour cream. Cream. Ice cream. Cream cheese. Milk shakes. Beverages Coffee and tea, with or without caffeine. Carbonated beverages. Sodas. Energy drinks. Fruit juice made with acidic fruits (such as orange or grapefruit). Tomato juice. Alcoholic drinks. Fats and oils Butter. Margarine. Shortening. Ghee. Sweets and desserts Chocolate and  cocoa. Donuts. Seasoning and other foods Pepper. Peppermint and spearmint. Any condiments, herbs, or seasonings that cause symptoms. For some people, this may include curry, hot sauce, or vinegar-based salad dressings. Summary  When you have gastroesophageal reflux disease (GERD), food and lifestyle choices are very important to help ease the discomfort of GERD.  Eat frequent, small meals instead of three large meals each day. Eat your meals slowly, in a relaxed setting. Avoid bending over or lying down until 2-3 hours after eating.  Limit high-fat foods such as fatty meat or fried foods. This information is not intended to replace advice given to you by your health care provider. Make sure you discuss any questions you have with your health care provider. Document Released: 09/04/2005 Document Revised: 09/05/2016 Document Reviewed: 09/05/2016 Elsevier Interactive Patient Education  2017 [employer].    IF you received an x-ray today, you will receive an invoice from [facility]. Please contact [facility] at [phone] with questions or concerns regarding your invoice.   IF you received labwork today, you will receive an invoice from [place]. Please contact [facility] at [phone] with questions or concerns regarding your invoice.   Our billing staff will not be able to assist you with questions regarding bills from these companies.  You will be contacted with the lab results as soon as they are available. The fastest way to get your results is to activate your My Chart account.  Instructions are located on the last page of this paperwork. If you have not heard from [place] regarding the results in 2 weeks, please contact this office.       I personally performed the services described in this documentation, which was scribed in my presence. The recorded information has been reviewed and considered for accuracy and completeness, addended by me as needed, and agree with information above.  Signed,   Merri Ray, MD [facility].  07/04/17 5:54 PM

## 2017-07-02 ENCOUNTER — Ambulatory Visit: Payer: PPO | Admitting: Physician Assistant

## 2017-07-03 LAB — H. PYLORI BREATH TEST: H pylori Breath Test: NEGATIVE

## 2017-07-04 ENCOUNTER — Telehealth: Payer: Self-pay | Admitting: Family Medicine

## 2017-07-04 NOTE — Telephone Encounter (Signed)
[place] - Pt wants to know lab results  [id number]

## 2017-07-09 NOTE — Telephone Encounter (Signed)
Communicated through [place] on 10/17

## 2017-07-23 ENCOUNTER — Encounter: Payer: Self-pay | Admitting: Allergy & Immunology

## 2017-07-23 ENCOUNTER — Ambulatory Visit: Payer: Self-pay | Admitting: *Deleted

## 2017-07-23 ENCOUNTER — Ambulatory Visit (INDEPENDENT_AMBULATORY_CARE_PROVIDER_SITE_OTHER): Payer: PPO | Admitting: Allergy & Immunology

## 2017-07-23 VITALS — BP 110/80 | HR 67 | Temp 97.8°F | Resp 16

## 2017-07-23 DIAGNOSIS — L500 Allergic urticaria: Secondary | ICD-10-CM | POA: Diagnosis not present

## 2017-07-23 DIAGNOSIS — J3089 Other allergic rhinitis: Secondary | ICD-10-CM | POA: Diagnosis not present

## 2017-07-23 DIAGNOSIS — J302 Other seasonal allergic rhinitis: Secondary | ICD-10-CM

## 2017-07-23 NOTE — Progress Notes (Signed)
FOLLOW UP  Date of Service/Encounter:  07/23/17   Assessment:   Allergic urticaria - improved on Xolair  Seasonal and perennial allergic rhinitis  Plan/Recommendations:   1. Allergic urticaria - We will restart your Xolair today. - We are starting you on a prednisone course in order to help with your hives and your nasal congestion. - Come back in one month for your next Xolair injection.   2. Seasonal and perennial allergic rhinitis - Stop taking: fluticasone nasal spray and the azelastine eye drops. - Start taking: Dymista (fluticasone/azelastine) two sprays per nostril 1-2 times daily as needed and Pazeo (olopatadine) one drop per eye daily as needed - You can use an extra dose of the antihistamine, if needed, for breakthrough symptoms.  - Consider nasal saline rinses 1-2 times daily to remove allergens from the nasal cavities as well as help with mucous clearance (this is especially helpful to do before the nasal sprays are given) - Consider allergy shots as a means of long-term control. - Allergy shots "re-train" and "reset" the immune system to ignore environmental allergens and decrease the resulting immune response to those allergens (sneezing, itchy watery eyes, runny nose, nasal congestion, etc).    - Allergy shots improve symptoms in 75-85% of patients.   3. Return in about 3 months (around 10/23/2017).   Subjective:   Jeremy Villa is a 26 y.o. male presenting today for follow up of  Chief Complaint  Patient presents with  . Nasal Congestion  . Urticaria    Jeremy Villa has a history of the following: Patient Active Problem List   Diagnosis Date Noted  . Seasonal and perennial allergic rhinitis 07/23/2017  . OCD (obsessive compulsive disorder) 04/17/2016  . Hypogonadism in male 12/24/2015  . Generalized anxiety disorder 12/24/2015  . Erectile dysfunction 12/24/2015  . Allergic urticaria 09/01/2015  . Allergic rhinoconjunctivitis 09/01/2015  . Dry eye  syndrome 09/01/2015    History obtained from: chart review and patient.  Jeremy Villa's Primary Care Provider is Patient, No Pcp Per.     Jeremy Villa is a 26 y.o. male presenting for a follow up visit. He was last seen in April 2017 for an office visit. At that time, he was doing with Xolair for his chronic urticaria. He had been treated with doxycyline as a means of addressing the presumed rosacea on his eye lids. He continued to have nasal congestion and sneezing. He was interested in pursuing allergen immunotherapy at that time. He did start immunotherapy, but has not received any allergy shots since December 2017. His last Xolair dose was December 2018. He had a workup for hives upon his first visit with Dr. Neldon Mc, and this was normal. He had blood environmental allergy testing performed in August 2017 that showed positives to dust mites, grasses, molds, trees, weeds, and dog.  Since the last visit, he has not done well. He did just return from a prolonged trip to [place], where he was for six months. His last Xolair was in April 2018, and two months later his urticaria returned. He has been treated with Zyrtec without much improvement. He did not continue his Xolair in [place] since he did not think that it could be obtained there. He would like to restart his Xolair now that he is back in the [place].   He is also complaining about allergic rhinitis symptoms including marked nasal congestion and itchy eyes. The azelastine eye drops do not appear to be providing any benefit, and  the fluticasone nasal spray has not helped either. He has been using up to four sprays of fluticasone nasal spray per nostril throughout the day. He is interested in trying a new nasal hygiene regimen.   Otherwise, there have been no changes to his past medical history, surgical history, family history, or social history.    Review of Systems: a 14-point review of systems is pertinent for what is mentioned  in HPI.  Otherwise, all other systems were negative. Constitutional: negative other than that listed in the HPI Eyes: negative other than that listed in the HPI Ears, nose, mouth, throat, and face: negative other than that listed in the HPI Respiratory: negative other than that listed in the HPI Cardiovascular: negative other than that listed in the HPI Gastrointestinal: negative other than that listed in the HPI Genitourinary: negative other than that listed in the HPI Integument: negative other than that listed in the HPI Hematologic: negative other than that listed in the HPI Musculoskeletal: negative other than that listed in the HPI Neurological: negative other than that listed in the HPI Allergy/Immunologic: negative other than that listed in the HPI    Objective:   Blood pressure 110/80, pulse 67, temperature 97.8 F (36.6 C), temperature source Oral, resp. rate 16, SpO2 97 %. There is no height or weight on file to calculate BMI.   Physical Exam:  General: Alert, interactive, in no acute distress. Pleasant. Eyes: Conjunctival injection on the right with limbal sparing, Conjunctival injection on the left with limbal sparing, no discharge on the right, no discharge on the left, no Horner-Trantas dots present and allergic shiners present bilaterally. PERRL bilaterally. EOMI without pain. No photophobia.  Ears: Right TM pearly gray with normal light reflex, Left TM pearly gray with normal light reflex, Right TM intact without perforation and Left TM intact without perforation.  Nose/Throat: External nose within normal limits and septum midline. Turbinates markedly edematous and pale with clear discharge. Posterior oropharynx mildly erythematous without cobblestoning in the posterior oropharynx. Tonsils 2+ without exudates.  Tongue without thrush. Adenopathy: shoddy bilateral anterior cervical lymphadenopathy and no enlarged lymph nodes appreciated in the occipital, axillary,  epitrochlear, inguinal, or popliteal regions. Lungs: Clear to auscultation without wheezing, rhonchi or rales. No increased work of breathing. CV: Normal S1/S2. No murmurs. Capillary refill <2 seconds.  Skin: Warm and dry, without lesions or rashes. No urticaria appreciated, but he does have excoriations on his arms bilaterally.  Neuro:   Grossly intact. No focal deficits appreciated. Responsive to questions.  Diagnostic studies: none     Salvatore Marvel, MD [place] [facility] [place]

## 2017-07-23 NOTE — Patient Instructions (Addendum)
1. Allergic urticaria - We will restart your Xolair today. - We are starting you on a prednisone course in order to help with your hives and your nasal congestion. - Come back in one month for your next Xolair injection.   2. Seasonal and perennial allergic rhinitis - Stop taking: fluticasone nasal spray and the azelastine eye drops. - Start taking: Dymista (fluticasone/azelastine) two sprays per nostril 1-2 times daily as needed and Pazeo (olopatadine) one drop per eye daily as needed - You can use an extra dose of the antihistamine, if needed, for breakthrough symptoms.  - Consider nasal saline rinses 1-2 times daily to remove allergens from the nasal cavities as well as help with mucous clearance (this is especially helpful to do before the nasal sprays are given) - Consider allergy shots as a means of long-term control. - Allergy shots "re-train" and "reset" the immune system to ignore environmental allergens and decrease the resulting immune response to those allergens (sneezing, itchy watery eyes, runny nose, nasal congestion, etc).    - Allergy shots improve symptoms in 75-85% of patients.   3. Return in about 3 months (around 10/23/2017).   Please inform us of any Emergency Department visits, hospitalizations, or changes in symptoms. Call us before going to the ED for breathing or allergy symptoms since we might be able to fit you in for a sick visit. Feel free to contact us anytime with any questions, problems, or concerns.  It was a pleasure to meet you and your family today! Enjoy the fall season!  Websites that have reliable patient information: 1. American Academy of Asthma, Allergy, and Immunology: www.aaaai.org 2. Food Allergy Research and Education (FARE): foodallergy.org 3. Mothers of Asthmatics: http://www.asthmacommunitynetwork.org 4. American College of Allergy, Asthma, and Immunology: www.acaai.org

## 2017-07-24 ENCOUNTER — Encounter: Payer: Self-pay | Admitting: Allergy & Immunology

## 2017-07-24 MED ORDER — OLOPATADINE HCL 0.7 % OP SOLN: 1.0000 [drp] | Freq: Every day | OPHTHALMIC | 5 refills | Status: DC | PRN

## 2017-07-24 MED ORDER — AZELASTINE-FLUTICASONE 137-50 MCG/ACT NA SUSP: 1.0000 | Freq: Two times a day (BID) | NASAL | 5 refills | Status: AC | PRN

## 2017-07-24 NOTE — Addendum Note (Signed)
Addended by: Angelica Ran on: 07/24/2017 06:40 PM   Modules accepted: Orders

## 2017-07-25 ENCOUNTER — Telehealth: Payer: Self-pay | Admitting: Allergy

## 2017-07-25 MED ORDER — FLUTICASONE PROPIONATE 50 MCG/ACT NA SUSP: 1.0000 | Freq: Two times a day (BID) | NASAL | 5 refills | Status: AC

## 2017-07-25 MED ORDER — AZELASTINE HCL 0.1 % NA SOLN: 2.0000 | Freq: Two times a day (BID) | NASAL | 5 refills | Status: AC

## 2017-07-25 MED ORDER — OLOPATADINE HCL 0.2 % OP SOLN: 1.0000 [drp] | OPHTHALMIC | 5 refills | Status: AC

## 2017-07-25 NOTE — Telephone Encounter (Signed)
Receive for PA for Pazeo and Dymista. We are switching Pazeo to Pataday and Dymista to Azelastine and Flonase.

## 2017-08-20 ENCOUNTER — Ambulatory Visit (INDEPENDENT_AMBULATORY_CARE_PROVIDER_SITE_OTHER): Payer: PPO | Admitting: *Deleted

## 2017-08-20 ENCOUNTER — Ambulatory Visit: Payer: Self-pay

## 2017-08-20 DIAGNOSIS — L501 Idiopathic urticaria: Secondary | ICD-10-CM | POA: Diagnosis not present

## 2017-08-20 DIAGNOSIS — L508 Other urticaria: Secondary | ICD-10-CM

## 2017-09-05 ENCOUNTER — Other Ambulatory Visit: Payer: Self-pay | Admitting: Allergy and Immunology

## 2017-09-12 ENCOUNTER — Encounter: Payer: Self-pay | Admitting: Allergy & Immunology

## 2017-09-14 NOTE — Telephone Encounter (Signed)
We are changing Mr. Kirkendoll from [place] to [place]. He may be coming by for a tutorial.   Thanks, Salvatore Marvel, MD [facility] [place]

## 2017-09-17 ENCOUNTER — Ambulatory Visit (INDEPENDENT_AMBULATORY_CARE_PROVIDER_SITE_OTHER): Payer: PPO

## 2017-09-17 DIAGNOSIS — L500 Allergic urticaria: Secondary | ICD-10-CM | POA: Diagnosis not present

## 2017-10-02 ENCOUNTER — Ambulatory Visit (INDEPENDENT_AMBULATORY_CARE_PROVIDER_SITE_OTHER): Payer: PPO | Admitting: Family Medicine

## 2017-10-02 ENCOUNTER — Encounter: Payer: Self-pay | Admitting: Family Medicine

## 2017-10-02 VITALS — BP 122/72 | HR 69 | Temp 98.2°F | Resp 17 | Ht 66.0 in | Wt 145.0 lb

## 2017-10-02 DIAGNOSIS — R7303 Prediabetes: Secondary | ICD-10-CM

## 2017-10-02 LAB — POCT GLYCOSYLATED HEMOGLOBIN (HGB A1C): Hemoglobin A1C: 5.8

## 2017-10-02 NOTE — Patient Instructions (Addendum)
Great work on [employer] changes and exercise!  Hemoglobin A1c is slightly improved, and overall stable. I would not recommend any medications for now, but recommend repeating that test in another 3 months.  If your blood sugars start to increase significantly, please return sooner.  Let us know if you have questions in the meantime.    IF you received an x-ray today, you will receive an invoice from [facility]. Please contact [facility] at [phone] with questions or concerns regarding your invoice.   IF you received labwork today, you will receive an invoice from [place]. Please contact [facility] at [phone] with questions or concerns regarding your invoice.   Our billing staff will not be able to assist you with questions regarding bills from these companies.  You will be contacted with the lab results as soon as they are available. The fastest way to get your results is to activate your My Chart account. Instructions are located on the last page of this paperwork. If you have not heard from [place] regarding the results in 2 weeks, please contact this office.

## 2017-10-02 NOTE — Progress Notes (Signed)
Subjective:  This chart was scribed for Jeremy Agreste, MD by Tamsen Roers, at [facility].  This patient was seen in room 3 and the patient's care was started at 8:14 AM.   Chief Complaint  Patient presents with  . Other    prediabetes      Patient ID: Jeremy Villa, male    DOB: 1991/06/08, 27 y.o.   MRN: [id number]  HPI HPI Comments: Jeremy Villa is a 27 y.o. male who presents to Primary Care at [facility]. Patient was last seen October. He had been recently diagnosed with pre-diabetes with A1C of 5.9 on October 2nd.  Initial diet approach and exercise was discussed. When he saw me his blood sugars had been averaging 109 to 110 and had started exercising two weeks prior. --- Patient checks his blood sugar levels every week.  After exercising this past week, it was ranging around 92 and prior to this week, it was ranging over the 100's.  He eats three meals per day and is exercising daily.   Denies any chest pains, difficulty breathing, abdominal pain, nausea or vomiting.    Wt Readings from Last 3 Encounters:  10/02/17 145 lb (65.8 kg)  06/30/17 147 lb (66.7 kg)  06/19/17 150 lb (68 kg)      Patient Active Problem List   Diagnosis Date Noted  . Seasonal and perennial allergic rhinitis 07/23/2017  . OCD (obsessive compulsive disorder) 04/17/2016  . Hypogonadism in male 12/24/2015  . Generalized anxiety disorder 12/24/2015  . Erectile dysfunction 12/24/2015  . Allergic urticaria 09/01/2015  . Allergic rhinoconjunctivitis 09/01/2015  . Dry eye syndrome 09/01/2015   Past Medical History:  Diagnosis Date  . Allergic rhinoconjunctivitis   . Allergy   . Anxiety   . Depression   . Neuromuscular disorder ([place])   . Urticaria    Past Surgical History:  Procedure Laterality Date  . EYE SURGERY    . LASIK     Allergies  Allergen Reactions  . Tree Extract Itching  . Wheat Bran Itching    Grass, weeds.    Prior to Admission medications   Medication  Sig Start Date End Date Taking? Authorizing Provider  azelastine (ASTELIN) 0.1 % nasal spray Place 2 sprays 2 (two) times daily into both nostrils. 07/25/17  Yes Valentina Shaggy, MD  Azelastine-Fluticasone [phone] MCG/ACT SUSP Place 1-2 sprays 2 (two) times daily as needed into the nose. 07/24/17  Yes Valentina Shaggy, MD  EPINEPHrine (EPIPEN 2-PAK) 0.3 mg/0.3 mL IJ SOAJ injection Inject into the muscle. Reported on 12/24/2015   Yes [provider]  fexofenadine (ALLEGRA) 180 MG tablet Take 180 mg by mouth. Reported on 09/28/2015   Yes [provider]  fluticasone (FLONASE) 50 MCG/ACT nasal spray Place 1-2 sprays 2 (two) times daily into both nostrils. 07/25/17  Yes Valentina Shaggy, MD  Olopatadine HCl (PATADAY) 0.2 % SOLN Place 1 drop 1 day or 1 dose into both eyes. 07/25/17  Yes Valentina Shaggy, MD  Olopatadine HCl 0.6 % SOLN Place into the nose at bedtime. Reported on 12/24/2015   Yes [provider]  XOLAIR 150 MG injection INJECT 300MG  SUBCUTANEOUSLY EVERY 28 DAYS. MUST BE ADMINISTERED UNDER THE SUPERVISION OF A HEALTHCARE PROFESSIONAL. 09/05/17  Yes Kozlow, Donnamarie Poag, MD   Social History   Socioeconomic History  . Marital status: Married    Spouse name: Not on file  . Number of children: Not on file  . Years of education:  Not on file  . Highest education level: Not on file  Social Needs  . Financial resource strain: Not on file  . Food insecurity - worry: Not on file  . Food insecurity - inability: Not on file  . Transportation needs - medical: Not on file  . Transportation needs - non-medical: Not on file  Occupational History  . Not on file  Tobacco Use  . Smoking status: Former Smoker    Last attempt to quit: 08/25/2014    Years since quitting: 3.1  . Smokeless tobacco: Never Used  Substance and Sexual Activity  . Alcohol use: No  . Drug use: No  . Sexual activity: Not on file  Other Topics Concern  . Not on file  Social History  Narrative  . Not on file      Review of Systems  Constitutional: Negative for chills and fever.  Eyes: Negative for pain and redness.  Respiratory: Negative for cough and shortness of breath.   Gastrointestinal: Negative for abdominal pain, nausea and vomiting.       Objective:   Physical Exam  Constitutional: He is oriented to person, place, and time. He appears well-developed and well-nourished. No distress.  HENT:  Head: Normocephalic and atraumatic.  Cardiovascular: Normal rate and regular rhythm.  Pulmonary/Chest: Effort normal. No respiratory distress.  Neurological: He is alert and oriented to person, place, and time.  Skin: Skin is warm and dry.   Vitals:   10/02/17 0808  BP: 122/72  Pulse: 69  Resp: 17  Temp: 98.2 F (36.8 C)  TempSrc: Oral  SpO2: 98%  Weight: 145 lb (65.8 kg)  Height: 5\' 6"  (1.676 m)   Results for orders placed or performed in visit on 10/02/17  POCT glycosylated hemoglobin (Hb A1C)  Result Value Ref Range   Hemoglobin A1C 5.8       Assessment & Plan:  Jeremy Villa is a 27 y.o. male Prediabetes - Plan: POCT glycosylated hemoglobin (Hb A1C) Overall stable A1c, min improvement, but home blood sugars have been improved recently. Commended on diet and exercise changes, no change in regimen for now. Recheck A1c in 3 months. RTC precautions if elevating readings at home.  No orders of the defined types were placed in this encounter.  Patient Instructions   Doristine Devoid work on the diet changes and exercise!  Hemoglobin A1c is slightly improved, and overall stable. I would not recommend any medications for now, but recommend repeating that test in another 3 months.  If your blood sugars start to increase significantly, please return sooner.  Let us know if you have questions in the meantime.    IF you received an x-ray today, you will receive an invoice from [facility]. Please contact [facility] at [phone] with questions  or concerns regarding your invoice.   IF you received labwork today, you will receive an invoice from [place]. Please contact [facility] at [phone] with questions or concerns regarding your invoice.   Our billing staff will not be able to assist you with questions regarding bills from these companies.  You will be contacted with the lab results as soon as they are available. The fastest way to get your results is to activate your My Chart account. Instructions are located on the last page of this paperwork. If you have not heard from [place] regarding the results in 2 weeks, please contact this office.       I personally performed the services described in this documentation, which was scribed  in my presence. The recorded information has been reviewed and considered for accuracy and completeness, addended by me as needed, and agree with information above.  Signed,   Merri Ray, MD Primary Care at [place].  10/02/17 8:33 AM

## 2017-10-15 ENCOUNTER — Ambulatory Visit: Payer: Self-pay

## 2017-10-16 ENCOUNTER — Ambulatory Visit (INDEPENDENT_AMBULATORY_CARE_PROVIDER_SITE_OTHER): Payer: PPO | Admitting: *Deleted

## 2017-10-16 DIAGNOSIS — L500 Allergic urticaria: Secondary | ICD-10-CM

## 2017-10-23 ENCOUNTER — Ambulatory Visit: Payer: Self-pay | Admitting: Allergy and Immunology

## 2017-11-13 ENCOUNTER — Ambulatory Visit: Payer: Self-pay

## 2017-11-16 ENCOUNTER — Ambulatory Visit (INDEPENDENT_AMBULATORY_CARE_PROVIDER_SITE_OTHER): Payer: PPO | Admitting: *Deleted

## 2017-11-16 DIAGNOSIS — L500 Allergic urticaria: Secondary | ICD-10-CM

## 2017-12-10 ENCOUNTER — Ambulatory Visit (INDEPENDENT_AMBULATORY_CARE_PROVIDER_SITE_OTHER): Payer: PPO | Admitting: Physician Assistant

## 2017-12-10 ENCOUNTER — Encounter: Payer: Self-pay | Admitting: Physician Assistant

## 2017-12-10 ENCOUNTER — Other Ambulatory Visit: Payer: Self-pay

## 2017-12-10 VITALS — BP 108/66 | HR 72 | Temp 98.4°F | Resp 16 | Ht 65.0 in | Wt 137.8 lb

## 2017-12-10 DIAGNOSIS — R21 Rash and other nonspecific skin eruption: Secondary | ICD-10-CM

## 2017-12-10 MED ORDER — CLOTRIMAZOLE 1 % EX CREA: 1.0000 "application " | TOPICAL_CREAM | Freq: Two times a day (BID) | CUTANEOUS | 0 refills | Status: AC

## 2017-12-10 NOTE — Patient Instructions (Addendum)
  Apply the cream twice a day.  After applying the cream please apply a small amount of Vaseline for dryness.  Come back here if you have any concerns.   IF you received an x-ray today, you will receive an invoice from [facility]. Please contact [facility] at [phone] with questions or concerns regarding your invoice.   IF you received labwork today, you will receive an invoice from [place]. Please contact [facility] at [phone] with questions or concerns regarding your invoice.   Our billing staff will not be able to assist you with questions regarding bills from these companies.  You will be contacted with the lab results as soon as they are available. The fastest way to get your results is to activate your My Chart account. Instructions are located on the last page of this paperwork. If you have not heard from [place] regarding the results in 2 weeks, please contact this office.

## 2017-12-10 NOTE — Progress Notes (Signed)
    12/10/2017 9:33 AM   DOB: Feb 04, 1991 / MRN: [id number]  SUBJECTIVE:  Jeremy Villa is a 27 y.o. male presenting for itchy rash about the penis along with  "shriveling."  Started 2 weeks ago.  No new medications.  Patient does take Xolair.  Is not tried any medications for this problem specifically.  Sexually active with his wife of 6 months.  No other partners  He is allergic to tree extract and wheat bran.   He  has a past medical history of Allergic rhinoconjunctivitis, Allergy, Anxiety, Depression, Neuromuscular disorder ([place]), and Urticaria.    He  reports that he quit smoking about 3 years ago. He has never used smokeless tobacco. He reports that he does not drink alcohol or use drugs. He  has no sexual activity history on file. The patient  has a past surgical history that includes LASIK and Eye surgery.  His family history includes Diabetes in his mother; High blood pressure in his father.  Review of Systems  Constitutional: Negative for fever.  Skin: Positive for itching and rash.    The problem list and medications were reviewed and updated by myself where necessary and exist elsewhere in the encounter.   OBJECTIVE:  BP 108/66   Pulse 72   Temp 98.4 F (36.9 C) (Oral)   Resp 16   Ht 5\' 5"  (1.651 m)   Wt 137 lb 12.8 oz (62.5 kg)   SpO2 96%   BMI 22.93 kg/m   Physical Exam  Constitutional: He appears well-developed. He is active and cooperative.  Non-toxic appearance.  Cardiovascular: Normal rate.  Pulmonary/Chest: Effort normal. No tachypnea.  Abdominal: Hernia confirmed negative in the right inguinal area and confirmed negative in the left inguinal area.  Genitourinary: Testes normal.    Right testis shows no mass, no swelling and no tenderness. Right testis is descended. Cremasteric reflex is not absent on the right side. Left testis shows no mass, no swelling and no tenderness. Left testis is descended. Cremasteric reflex is not absent on the left side.  Circumcised. No phimosis, paraphimosis, hypospadias, penile erythema or penile tenderness. No discharge found.  Lymphadenopathy:       Right: No inguinal adenopathy present.       Left: No inguinal adenopathy present.  Neurological: He is alert.  Skin: Skin is warm and dry. He is not diaphoretic. No pallor.  Vitals reviewed.   No results found for this or any previous visit (from the past 72 hour(s)).  No results found.  ASSESSMENT AND PLAN:  Jeremy Villa was seen today for penis.  Diagnoses and all orders for this visit:  Rash and nonspecific skin eruption: 27 year old male with a history of prediabetes here today for rash.  I see no specific abnormality on his exam.  Of advised he apply Lotrimin cream along with Vaseline for dryness.  He can come back if he has any concerns.  Patient would like to be screened for urethritis. -     clotrimazole (LOTRIMIN) 1 % cream; Apply 1 application topically 2 (two) times daily.    The patient is advised to call or return to clinic if he does not see an improvement in symptoms, or to seek the care of the closest emergency department if he worsens with the above plan.   Philis Fendt, MHS, PA-C [facility] 12/10/2017 9:33 AM

## 2017-12-11 LAB — GC/CHLAMYDIA PROBE AMP
Chlamydia trachomatis, NAA: NEGATIVE
Neisseria gonorrhoeae by PCR: NEGATIVE

## 2017-12-11 LAB — TRICHOMONAS VAGINALIS, PROBE AMP: Trich vag by NAA: NEGATIVE

## 2017-12-13 ENCOUNTER — Ambulatory Visit (INDEPENDENT_AMBULATORY_CARE_PROVIDER_SITE_OTHER): Payer: PPO | Admitting: *Deleted

## 2017-12-13 DIAGNOSIS — L500 Allergic urticaria: Secondary | ICD-10-CM | POA: Diagnosis not present

## 2017-12-15 ENCOUNTER — Encounter: Payer: Self-pay | Admitting: Family Medicine

## 2017-12-15 ENCOUNTER — Encounter: Payer: Self-pay | Admitting: Allergy & Immunology

## 2017-12-26 ENCOUNTER — Encounter: Payer: Self-pay | Admitting: Physician Assistant

## 2018-01-08 ENCOUNTER — Ambulatory Visit: Payer: PPO | Admitting: Family Medicine

## 2018-01-08 ENCOUNTER — Encounter: Payer: Self-pay | Admitting: Physician Assistant

## 2018-01-09 ENCOUNTER — Other Ambulatory Visit: Payer: Self-pay

## 2018-01-09 ENCOUNTER — Telehealth: Payer: Self-pay | Admitting: Family Medicine

## 2018-01-09 MED ORDER — FREESTYLE LIBRE SENSOR SYSTEM MISC: 0 refills | Status: DC

## 2018-01-09 NOTE — Telephone Encounter (Signed)
Copied from [place] #[id number]. Topic: Quick Communication - Rx Refill/Question >> Jan 09, 2018  9:35 AM Lennox Solders wrote: Medication: sensor and reader for freestyle libre Has the patient contacted their pharmacy?no . [facility] [address] is calling needing  clarification they received two rx for sensor. Pharn need rx for reader for freestyle Elenor Legato

## 2018-01-09 NOTE — Telephone Encounter (Signed)
Spoke to pharmacist - confirmed freestyle [place] system includes the reader.

## 2018-01-10 ENCOUNTER — Ambulatory Visit: Payer: PPO

## 2018-01-12 ENCOUNTER — Ambulatory Visit (INDEPENDENT_AMBULATORY_CARE_PROVIDER_SITE_OTHER): Payer: PPO | Admitting: Physician Assistant

## 2018-01-12 ENCOUNTER — Other Ambulatory Visit: Payer: Self-pay

## 2018-01-12 ENCOUNTER — Encounter: Payer: Self-pay | Admitting: Physician Assistant

## 2018-01-12 VITALS — BP 126/68 | HR 83 | Temp 99.0°F | Resp 18 | Ht 65.51 in | Wt 139.0 lb

## 2018-01-12 DIAGNOSIS — R7303 Prediabetes: Secondary | ICD-10-CM

## 2018-01-12 LAB — POCT GLYCOSYLATED HEMOGLOBIN (HGB A1C): Hemoglobin A1C: 5.7

## 2018-01-12 NOTE — Progress Notes (Signed)
PRIMARY CARE AT [place], [place] [address] [phone]  Date:  01/12/2018   Name:  Jeremy Villa   DOB:  1991-01-30   MRN:  [id number]  PCP:  Patient, No Pcp Per    History of Present Illness:  Jeremy Villa is a 27 y.o. male patient who presents to PCP with  Chief Complaint  Patient presents with  . Prediabetes    A1C Check     He is exercising 5 days per week.   He is eating healthy foods such as brown rice, lamb.   No desserts.   Uses splenda, and sweet low.   Fasting glucose ranges from 104-115 No polydipsia, nausea, diarrhea, blurry vision. 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  Wt Readings from Last 3 Encounters:  01/12/18 139 lb (63 kg)  12/10/17 137 lb 12.8 oz (62.5 kg)  10/02/17 145 lb (65.8 kg)     Patient Active Problem List   Diagnosis Date Noted  . Seasonal and perennial allergic rhinitis 07/23/2017  . OCD (obsessive compulsive disorder) 04/17/2016  . Hypogonadism in male 12/24/2015  . Generalized anxiety disorder 12/24/2015  . Erectile dysfunction 12/24/2015  . Allergic urticaria 09/01/2015  . Allergic rhinoconjunctivitis 09/01/2015  . Dry eye syndrome 09/01/2015    Past Medical History:  Diagnosis Date  . Allergic rhinoconjunctivitis   . Allergy   . Anxiety   . Depression   . Elevated hemoglobin A1c   . Neuromuscular disorder ([place])   . Urticaria     Past Surgical History:  Procedure Laterality Date  . EYE SURGERY    . LASIK      Social History   Tobacco Use  . Smoking status: Former Smoker    Last attempt to quit: 08/25/2014    Years since quitting: 3.3  . Smokeless tobacco: Never Used  Substance Use Topics  . Alcohol use: No  . Drug use: No    Family History  Problem Relation Age of Onset  . Diabetes Mother   . High blood pressure Father     Allergies  Allergen Reactions  . Tree Extract Itching  . Wheat Bran Itching    Grass, weeds.     Medication list has been reviewed and  updated.  Current Outpatient Medications on File Prior to Visit  Medication Sig Dispense Refill  . clotrimazole (LOTRIMIN) 1 % cream Apply 1 application topically 2 (two) times daily. 30 g 0  . azelastine (ASTELIN) 0.1 % nasal spray Place 2 sprays 2 (two) times daily into both nostrils. (Patient not taking: Reported on 01/12/2018) 30 mL 5  . Azelastine-Fluticasone 137-50 MCG/ACT SUSP Place 1-2 sprays 2 (two) times daily as needed into the nose. (Patient not taking: Reported on 01/12/2018) 23 g 5  . EPINEPHrine (EPIPEN 2-PAK) 0.3 mg/0.3 mL IJ SOAJ injection Inject into the muscle. Reported on 12/24/2015    . fexofenadine (ALLEGRA) 180 MG tablet Take 180 mg by mouth. Reported on 09/28/2015    . fluticasone (FLONASE) 50 MCG/ACT nasal spray Place 1-2 sprays 2 (two) times daily into both nostrils. (Patient not taking: Reported on 01/12/2018) 16 g 5  . Olopatadine HCl (PATADAY) 0.2 % SOLN Place 1 drop 1 day or 1 dose into both eyes. (Patient not taking: Reported on 12/10/2017) 1 Bottle 5  . Olopatadine HCl 0.6 % SOLN Place into the nose at bedtime. Reported on 12/24/2015    . XOLAIR 150 MG injection INJECT 300MG  SUBCUTANEOUSLY EVERY 28 DAYS.  MUST BE ADMINISTERED UNDER THE SUPERVISION OF A HEALTHCARE PROFESSIONAL. (Patient not taking: Reported on 01/12/2018) 2 vial 5   Current Facility-Administered Medications on File Prior to Visit  Medication Dose Route Frequency Provider Last Rate Last Dose  . omalizumab Arvid Right) injection 300 mg  300 mg Subcutaneous Q28 days Jiles Prows, MD   300 mg at 12/13/17 0916    ROS ROS otherwise unremarkable unless listed above.  Physical Examination: BP 126/68 (BP Location: Right Arm, Patient Position: Sitting, Cuff Size: Normal)   Pulse 83   Temp 99 F (37.2 C) (Oral)   Resp 18   Ht 5' 5.51" (1.664 m)   Wt 139 lb (63 kg)   SpO2 97%   BMI 22.77 kg/m  Ideal Body Weight: Weight in (lb) to have BMI = 25: 152.3  Physical Exam  Constitutional: He is oriented to person,  place, and time. He appears well-developed and well-nourished. No distress.  HENT:  Head: Normocephalic and atraumatic.  Eyes: Pupils are equal, round, and reactive to light. Conjunctivae and EOM are normal.  Cardiovascular: Normal rate.  Pulmonary/Chest: Effort normal. No respiratory distress.  Neurological: He is alert and oriented to person, place, and time.  Skin: Skin is warm and dry. He is not diaphoretic.  Psychiatric: He has a normal mood and affect. His behavior is normal.     Assessment and Plan: Jeremy Villa is a 27 y.o. male who is here today for cc of  Chief Complaint  Patient presents with  . Prediabetes    A1C Check   --still borderline, though has improved. --advised to continue diet and exercise.  Should be able to monitor this every 6-12 months.   Prediabetes - Plan: POCT glycosylated hemoglobin (Hb A1C)  Ivar Drape, PA-C [facility] 4/30/20199:38 AM

## 2018-01-12 NOTE — Patient Instructions (Addendum)
Diabetes Mellitus and Nutrition When you have diabetes (diabetes mellitus), it is very important to have healthy eating habits because your blood sugar (glucose) levels are greatly affected by what you eat and drink. Eating healthy foods in the appropriate amounts, at about the same times every day, can help you:  Control your blood glucose.  Lower your risk of heart disease.  Improve your blood pressure.  Reach or maintain a healthy weight.  Every person with diabetes is different, and each person has different needs for a meal plan. Your health care provider may recommend that you work with a diet and nutrition specialist (dietitian) to make a meal plan that is best for you. Your meal plan may vary depending on factors such as:  The calories you need.  The medicines you take.  Your weight.  Your blood glucose, blood pressure, and cholesterol levels.  Your activity level.  Other health conditions you have, such as heart or kidney disease.  How do carbohydrates affect me? Carbohydrates affect your blood glucose level more than any other type of food. Eating carbohydrates naturally increases the amount of glucose in your blood. Carbohydrate counting is a method for keeping track of how many carbohydrates you eat. Counting carbohydrates is important to keep your blood glucose at a healthy level, especially if you use insulin or take certain oral diabetes medicines. It is important to know how many carbohydrates you can safely have in each meal. This is different for every person. Your dietitian can help you calculate how many carbohydrates you should have at each meal and for snack. Foods that contain carbohydrates include:  Bread, cereal, rice, pasta, and crackers.  Potatoes and corn.  Peas, beans, and lentils.  Milk and yogurt.  Fruit and juice.  Desserts, such as cakes, cookies, ice cream, and candy.  How does alcohol affect me? Alcohol can cause a sudden decrease in blood  glucose (hypoglycemia), especially if you use insulin or take certain oral diabetes medicines. Hypoglycemia can be a life-threatening condition. Symptoms of hypoglycemia (sleepiness, dizziness, and confusion) are similar to symptoms of having too much alcohol. If your health care provider says that alcohol is safe for you, follow these guidelines:  Limit alcohol intake to no more than 1 drink per day for nonpregnant women and 2 drinks per day for men. One drink equals 12 oz of beer, 5 oz of wine, or 1 oz of hard liquor.  Do not drink on an empty stomach.  Keep yourself hydrated with water, diet soda, or unsweetened iced tea.  Keep in mind that regular soda, juice, and other mixers may contain a lot of sugar and must be counted as carbohydrates.  What are tips for following this plan? Reading food labels  Start by checking the serving size on the label. The amount of calories, carbohydrates, fats, and other nutrients listed on the label are based on one serving of the food. Many foods contain more than one serving per package.  Check the total grams (g) of carbohydrates in one serving. You can calculate the number of servings of carbohydrates in one serving by dividing the total carbohydrates by 15. For example, if a food has 30 g of total carbohydrates, it would be equal to 2 servings of carbohydrates.  Check the number of grams (g) of saturated and trans fats in one serving. Choose foods that have low or no amount of these fats.  Check the number of milligrams (mg) of sodium in one serving. Most people   should limit total sodium intake to less than 2,300 mg per day.  Always check the nutrition information of foods labeled as "low-fat" or "nonfat". These foods may be higher in added sugar or refined carbohydrates and should be avoided.  Talk to your dietitian to identify your daily goals for nutrients listed on the label. Shopping  Avoid buying canned, premade, or processed foods. These  foods tend to be high in fat, sodium, and added sugar.  Shop around the outside edge of the grocery store. This includes fresh fruits and vegetables, bulk grains, fresh meats, and fresh dairy. Cooking  Use low-heat cooking methods, such as baking, instead of high-heat cooking methods like deep frying.  Cook using healthy oils, such as olive, canola, or sunflower oil.  Avoid cooking with butter, cream, or high-fat meats. Meal planning  Eat meals and snacks regularly, preferably at the same times every day. Avoid going long periods of time without eating.  Eat foods high in fiber, such as fresh fruits, vegetables, beans, and whole grains. Talk to your dietitian about how many servings of carbohydrates you can eat at each meal.  Eat 4-6 ounces of lean protein each day, such as lean meat, chicken, fish, eggs, or tofu. 1 ounce is equal to 1 ounce of meat, chicken, or fish, 1 egg, or 1/4 cup of tofu.  Eat some foods each day that contain healthy fats, such as avocado, nuts, seeds, and fish. Lifestyle   Check your blood glucose regularly.  Exercise at least 30 minutes 5 or more days each week, or as told by your health care provider.  Take medicines as told by your health care provider.  Do not use any products that contain nicotine or tobacco, such as cigarettes and e-cigarettes. If you need help quitting, ask your health care provider.  Work with a counselor or diabetes educator to identify strategies to manage stress and any emotional and social challenges. What are some questions to ask my health care provider?  Do I need to meet with a diabetes educator?  Do I need to meet with a dietitian?  What number can I call if I have questions?  When are the best times to check my blood glucose? Where to find more information:  American Diabetes Association: diabetes.org/food-and-fitness/food  Academy of Nutrition and Dietetics:  www.eatright.org/resources/health/diseases-and-conditions/diabetes  National Institute of Diabetes and Digestive and Kidney Diseases (NIH): www.niddk.nih.gov/health-information/diabetes/overview/diet-eating-physical-activity Summary  A healthy meal plan will help you control your blood glucose and maintain a healthy lifestyle.  Working with a diet and nutrition specialist (dietitian) can help you make a meal plan that is best for you.  Keep in mind that carbohydrates and alcohol have immediate effects on your blood glucose levels. It is important to count carbohydrates and to use alcohol carefully. This information is not intended to replace advice given to you by your health care provider. Make sure you discuss any questions you have with your health care provider. Document Released: 06/01/2005 Document Revised: 10/09/2016 Document Reviewed: 10/09/2016 Elsevier Interactive Patient Education  2018 Elsevier Inc.     IF you received an x-ray today, you will receive an invoice from [facility]. Please contact [facility] at [phone] with questions or concerns regarding your invoice.   IF you received labwork today, you will receive an invoice from [facility]. Please contact [facility] at [phone] with questions or concerns regarding your invoice.   Our billing staff will not be able to assist you with questions regarding bills from these companies.    You will be contacted with the lab results as soon as they are available. The fastest way to get your results is to activate your My Chart account. Instructions are located on the last page of this paperwork. If you have not heard from us regarding the results in 2 weeks, please contact this office.      

## 2018-01-12 NOTE — Progress Notes (Signed)
PRIMARY CARE AT [place], [place] [id number] [phone]  Date:  01/12/2018   Name:  Jeremy Villa   DOB:  1991-09-07   MRN:  [id number]  PCP:  Patient, No Pcp Per    History of Present Illness:  Jeremy Villa is a 27 y.o. male patient who presents to PCP with  Chief Complaint  Patient presents with  . Prediabetes    A1C Check      Wt Readings from Last 3 Encounters:  01/12/18 139 lb (63 kg)  12/10/17 137 lb 12.8 oz (62.5 kg)  10/02/17 145 lb (65.8 kg)      Patient Active Problem List   Diagnosis Date Noted  . Seasonal and perennial allergic rhinitis 07/23/2017  . OCD (obsessive compulsive disorder) 04/17/2016  . Hypogonadism in male 12/24/2015  . Generalized anxiety disorder 12/24/2015  . Erectile dysfunction 12/24/2015  . Allergic urticaria 09/01/2015  . Allergic rhinoconjunctivitis 09/01/2015  . Dry eye syndrome 09/01/2015    Past Medical History:  Diagnosis Date  . Allergic rhinoconjunctivitis   . Allergy   . Anxiety   . Depression   . Elevated hemoglobin A1c   . Neuromuscular disorder ([place])   . Urticaria     Past Surgical History:  Procedure Laterality Date  . EYE SURGERY    . LASIK      Social History   Tobacco Use  . Smoking status: Former Smoker    Last attempt to quit: 08/25/2014    Years since quitting: 3.3  . Smokeless tobacco: Never Used  Substance Use Topics  . Alcohol use: No  . Drug use: No    Family History  Problem Relation Age of Onset  . Diabetes Mother   . High blood pressure Father     Allergies  Allergen Reactions  . Tree Extract Itching  . Wheat Bran Itching    Grass, weeds.     Medication list has been reviewed and updated.  Current Outpatient Medications on File Prior to Visit  Medication Sig Dispense Refill  . clotrimazole (LOTRIMIN) 1 % cream Apply 1 application topically 2 (two) times daily. 30 g 0  . Continuous Blood Gluc Sensor (FREESTYLE LIBRE SENSOR SYSTEM) MISC Monitor blood glucose  readings daily. Return for appt if numbers increasing 1 each 0  . azelastine (ASTELIN) 0.1 % nasal spray Place 2 sprays 2 (two) times daily into both nostrils. (Patient not taking: Reported on 01/12/2018) 30 mL 5  . Azelastine-Fluticasone 137-50 MCG/ACT SUSP Place 1-2 sprays 2 (two) times daily as needed into the nose. (Patient not taking: Reported on 01/12/2018) 23 g 5  . EPINEPHrine (EPIPEN 2-PAK) 0.3 mg/0.3 mL IJ SOAJ injection Inject into the muscle. Reported on 12/24/2015    . fexofenadine (ALLEGRA) 180 MG tablet Take 180 mg by mouth. Reported on 09/28/2015    . fluticasone (FLONASE) 50 MCG/ACT nasal spray Place 1-2 sprays 2 (two) times daily into both nostrils. (Patient not taking: Reported on 01/12/2018) 16 g 5  . Olopatadine HCl (PATADAY) 0.2 % SOLN Place 1 drop 1 day or 1 dose into both eyes. (Patient not taking: Reported on 12/10/2017) 1 Bottle 5  . Olopatadine HCl 0.6 % SOLN Place into the nose at bedtime. Reported on 12/24/2015    . XOLAIR 150 MG injection INJECT 300MG  SUBCUTANEOUSLY EVERY 28 DAYS. MUST BE ADMINISTERED UNDER THE SUPERVISION OF A HEALTHCARE PROFESSIONAL. (Patient not taking: Reported on 01/12/2018) 2 vial 5   Current Facility-Administered Medications on File Prior to Visit  Medication Dose Route Frequency Provider Last Rate Last Dose  . omalizumab Arvid Right) injection 300 mg  300 mg Subcutaneous Q28 days Jiles Prows, MD   300 mg at 12/13/17 0916    ROS ROS otherwise unremarkable unless listed above.  Physical Examination: BP 126/68 (BP Location: Right Arm, Patient Position: Sitting, Cuff Size: Normal)   Pulse 83   Temp 99 F (37.2 C) (Oral)   Resp 18   Ht 5' 5.51" (1.664 m)   Wt 139 lb (63 kg)   SpO2 97%   BMI 22.77 kg/m  Ideal Body Weight: Weight in (lb) to have BMI = 25: 152.3  Physical Exam   Assessment and Plan: Jeremy Villa is a 27 y.o. male who is here today  There are no diagnoses linked to this encounter.  Ivar Drape, PA-C [facility] 01/12/2018 2:54 PM

## 2018-02-06 ENCOUNTER — Telehealth: Payer: Self-pay | Admitting: *Deleted

## 2018-02-06 NOTE — Telephone Encounter (Signed)
Patient called and wanted to come in to get Xolair injection writer advised patient that his medication was not here and we would have to order. Patient states that it was shipped to [place] 3-4 weeks ago and wanted to know where it was. Writer called Scientist, research (life sciences) she states that medication was not ordered by her last month due to patient not being on nurses schedule advised he was but called and cancel appointment and upon ordering for the week he was due medication was not ordered since he was not on current nurse schedule for that week. Writer did call patient back and he was upset states that he has order# [id number] and fedex tracking #[id number] [email or web address] checked fedex web site and it was delivered to [place] on 01/01/18 but was signed by a [employer] and we do not have no one in this office working with that name. Patient states he was going to call insurance company to file a claim or have an investigation of where his medication went.

## 2018-02-06 NOTE — Telephone Encounter (Signed)
Tammy called Probation officer back and states she contacted [facility] and it was never shipped out order was canceled last minute by them. [facility] will call patient and explain this to him and get it to ship out.

## 2018-02-07 ENCOUNTER — Encounter: Payer: Self-pay | Admitting: Allergy & Immunology
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
